# Patient Record
Sex: Female | Born: 1935 | Race: Black or African American | Hispanic: No | Marital: Single | Smoking: Never smoker
Health system: Southern US, Community
[De-identification: ages and names within clinical notes are randomized; demographics above are authoritative.]

## PROBLEM LIST (undated history)

## (undated) DIAGNOSIS — D649 Anemia, unspecified: Secondary | ICD-10-CM

## (undated) DIAGNOSIS — I639 Cerebral infarction, unspecified: Secondary | ICD-10-CM

## (undated) DIAGNOSIS — E119 Type 2 diabetes mellitus without complications: Secondary | ICD-10-CM

## (undated) DIAGNOSIS — F0390 Unspecified dementia without behavioral disturbance: Secondary | ICD-10-CM

## (undated) DIAGNOSIS — N390 Urinary tract infection, site not specified: Secondary | ICD-10-CM

## (undated) DIAGNOSIS — I10 Essential (primary) hypertension: Secondary | ICD-10-CM

## (undated) DIAGNOSIS — N3090 Cystitis, unspecified without hematuria: Secondary | ICD-10-CM

## (undated) HISTORY — PX: ABDOMINAL HYSTERECTOMY: SHX81

## (undated) HISTORY — PX: JOINT REPLACEMENT: SHX530

## (undated) HISTORY — PX: BLADDER SURGERY: SHX569

## (undated) HISTORY — PX: EYE SURGERY: SHX253

---

## 2000-04-24 ENCOUNTER — Encounter: Admission: RE | Admit: 2000-04-24 | Discharge: 2000-04-24 | Payer: Self-pay | Admitting: Orthopedic Surgery

## 2000-04-24 ENCOUNTER — Encounter: Payer: Self-pay | Admitting: Orthopedic Surgery

## 2000-04-25 ENCOUNTER — Ambulatory Visit (HOSPITAL_BASED_OUTPATIENT_CLINIC_OR_DEPARTMENT_OTHER): Admission: RE | Admit: 2000-04-25 | Discharge: 2000-04-25 | Payer: Self-pay | Admitting: Orthopedic Surgery

## 2009-01-12 ENCOUNTER — Encounter (INDEPENDENT_AMBULATORY_CARE_PROVIDER_SITE_OTHER): Payer: Self-pay | Admitting: Emergency Medicine

## 2009-01-12 ENCOUNTER — Emergency Department (HOSPITAL_BASED_OUTPATIENT_CLINIC_OR_DEPARTMENT_OTHER): Admission: EM | Admit: 2009-01-12 | Discharge: 2009-01-12 | Payer: Self-pay | Admitting: Emergency Medicine

## 2009-01-22 ENCOUNTER — Emergency Department (HOSPITAL_BASED_OUTPATIENT_CLINIC_OR_DEPARTMENT_OTHER): Admission: EM | Admit: 2009-01-22 | Discharge: 2009-01-22 | Payer: Self-pay | Admitting: Emergency Medicine

## 2011-01-28 NOTE — Op Note (Signed)
[place]  Patient:    Jamie Valentine, Jamie Valentine                          MRN: [id number] Proc. Date: 04/29/00 Attending:  Alinda Deem                           Operative Report  PREOPERATIVE DIAGNOSIS:  Right knee medial meniscal tear.  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  OPERATION PERFORMED:  Right knee partial arthroscopic medial and lateral meniscectomies and debridement of chondromalacia.  SURGEON:  Alinda Deem, M.D.  ASSISTANT:  None.  ANESTHESIA:  General endotracheal.  ESTIMATED BLOOD LOSS:  Minimal.  FLUID REPLACEMENT:  700 cc crystalloid.  DRAINS:  None.  TOURNIQUET TIME:  None.  INDICATIONS FOR PROCEDURE:  The patient is a 77 year old woman with radiographic mild to moderate arthritic changes of the right knee.  She has failed conservative treatment with anti-inflammatory medicines x 3, physical therapy, got temporary relief from a cortisone injection and after putting up with many months of pain and catching in her knee, now desires arthroscopic evaluation of her right knee.  Standing radiographs showed she had 3 or 4 mm of articular cartilage medially and 5 mm laterally and arthroscopy was thought to be appropriate by both the patient and her surgeon.  DESCRIPTION OF PROCEDURE:  The patient was identified by arm band and taken to the operating room at [facility] where the appropriate anesthetic monitors were attached and general endotracheal anesthesia induced with the patient in the supine position.  A lateral post was applied to the table and the right lower extremity prepped and draped in the usual sterile fashion from the ankle to midthigh.  Using a #11 blade, standard inferomedial and inferolateral peripatellar portals were then made  allowing introduction of the arthroscope through the inferolateral portal and the outflow through the inferomedial portal.  Small bits of articular cartilage were noted to come out the outflow with the initial placement.   We then performed arthroscopic evaluation of the knee and located grade 3 chondromalacia of the apex of the patella which was debrided back to a stable margin with a 3.5 mm gator sucker shaver.  The medial meniscus had a degenerative parrot beak tear of the posterior horn which was debrided with a basket forceps as well as a 3.5 gator sucker shaver and a 4.2 mm great white sucker shaver.  The articular cartilage and medial femoral condyle had grade 3 chondromalacia over a 1.2 cm strip and a focal area of grade 4 chondromalacia was located on the medial tibial condyle with peripheral flap tears which were also debrided.  The ACL and the PCL were intact.  The lateral meniscus had degenerative tears only and these were debrided back to stable margin.  The articular cartilage on the lateral side was in relatively good condition.  The knee was then washed out with normal saline solution.  The arthroscopic instruments were removed.  A dressing of Xeroform, 4 x 8 dressing sponges, Webril and an Ace wrap applied. The patient was awakened and taken to the recovery room without difficulty. D:  05/17/00 TD:  05/18/00 Job: [id number]

## 2015-06-01 ENCOUNTER — Emergency Department (HOSPITAL_BASED_OUTPATIENT_CLINIC_OR_DEPARTMENT_OTHER)
Admission: EM | Admit: 2015-06-01 | Discharge: 2015-06-02 | Disposition: A | Discharge status: Home / Self Care | Payer: Medicare Other | Attending: Emergency Medicine | Admitting: Emergency Medicine

## 2015-06-01 ENCOUNTER — Encounter (HOSPITAL_BASED_OUTPATIENT_CLINIC_OR_DEPARTMENT_OTHER): Payer: Self-pay | Admitting: Emergency Medicine

## 2015-06-01 DIAGNOSIS — F0390 Unspecified dementia without behavioral disturbance: Secondary | ICD-10-CM | POA: Insufficient documentation

## 2015-06-01 DIAGNOSIS — I10 Essential (primary) hypertension: Secondary | ICD-10-CM | POA: Diagnosis not present

## 2015-06-01 DIAGNOSIS — D649 Anemia, unspecified: Secondary | ICD-10-CM | POA: Insufficient documentation

## 2015-06-01 DIAGNOSIS — Z8673 Personal history of transient ischemic attack (TIA), and cerebral infarction without residual deficits: Secondary | ICD-10-CM | POA: Diagnosis not present

## 2015-06-01 DIAGNOSIS — T839XXA Unspecified complication of genitourinary prosthetic device, implant and graft, initial encounter: Secondary | ICD-10-CM

## 2015-06-01 DIAGNOSIS — Z7982 Long term (current) use of aspirin: Secondary | ICD-10-CM | POA: Insufficient documentation

## 2015-06-01 DIAGNOSIS — T83098A Other mechanical complication of other indwelling urethral catheter, initial encounter: Secondary | ICD-10-CM | POA: Insufficient documentation

## 2015-06-01 DIAGNOSIS — Z79899 Other long term (current) drug therapy: Secondary | ICD-10-CM | POA: Insufficient documentation

## 2015-06-01 DIAGNOSIS — N390 Urinary tract infection, site not specified: Secondary | ICD-10-CM | POA: Diagnosis not present

## 2015-06-01 DIAGNOSIS — E119 Type 2 diabetes mellitus without complications: Secondary | ICD-10-CM | POA: Insufficient documentation

## 2015-06-01 DIAGNOSIS — Y846 Urinary catheterization as the cause of abnormal reaction of the patient, or of later complication, without mention of misadventure at the time of the procedure: Secondary | ICD-10-CM | POA: Insufficient documentation

## 2015-06-01 HISTORY — DX: Type 2 diabetes mellitus without complications: E11.9

## 2015-06-01 HISTORY — DX: Cerebral infarction, unspecified: I63.9

## 2015-06-01 HISTORY — DX: Anemia, unspecified: D64.9

## 2015-06-01 HISTORY — DX: Cystitis, unspecified without hematuria: N30.90

## 2015-06-01 HISTORY — DX: Unspecified dementia, unspecified severity, without behavioral disturbance, psychotic disturbance, mood disturbance, and anxiety: F03.90

## 2015-06-01 HISTORY — DX: Essential (primary) hypertension: I10

## 2015-06-01 HISTORY — DX: Urinary tract infection, site not specified: N39.0

## 2015-06-01 MED ORDER — HYDROCODONE-ACETAMINOPHEN 5-325 MG PO TABS
1.0000 | ORAL_TABLET | Freq: Once | ORAL | Status: DC
  Filled 2015-06-01: qty 1

## 2015-06-01 MED ORDER — ONDANSETRON 4 MG PO TBDP
4.0000 mg | ORAL_TABLET | Freq: Once | ORAL | Status: DC
  Filled 2015-06-01: qty 1

## 2015-06-01 NOTE — ED Notes (Signed)
Per [facility] pt c/o chronic foley cath pain, no distress not injury, no urinary difficulty noted

## 2015-06-01 NOTE — ED Notes (Signed)
Per daughter patient has frequent pain with foley catheters and has to have them changed out.  Reports she was going to go to [facility] where her normal doctors are but states that she did not want to wait there.  Unable to access records from care everywhere.  History obtained from CNA that patient has at home.  Daughter at the bedside.  Daughter states "usually they change her catheter out and then give her pain medicine and send her on her way".

## 2015-06-01 NOTE — ED Provider Notes (Signed)
TIME SEEN: 11:27 PM  CHIEF COMPLAINT: Foley catheter pain   HPI:  HPI Comments: Jamie Valentine is a 81 y.o. female, with history of chronic indwelling Foley catheter, dementia, CVA, diabetes who presents to the Emergency Department complaining of pain to genital region where foley catheter is placed. She had the first catheter placed 4 months ago and this catheter was changed 1 month ago per patient's report. Per nursing home however they state patient had a catheter changed 5 days ago. The pt has pain at baseline with foley catheters and has them changed frequently per daughter. Daughter reported to nurse that the pt is normally seen at [facility] where they change the catheter and give her pain medication. Pt notes abdominal pain earlier today that has since resolved. Denies fevers or chills, nausea, vomiting or diarrhea, or hematuria.   ROS: See HPI Constitutional: no fever  Eyes: no drainage  ENT: no runny nose   Cardiovascular:  no chest pain  Resp: no SOB  GI: no vomiting GU: no dysuria Integumentary: no rash  Allergy: no hives  Musculoskeletal: no leg swelling  Neurological: no slurred speech ROS otherwise negative  PAST MEDICAL HISTORY/PAST SURGICAL HISTORY:  Past Medical History  Diagnosis Date  . Stroke   . Cystitis   . Diabetes mellitus without complication   . UTI (urinary tract infection)   . Dementia   . Anemia   . Hypertension     MEDICATIONS:  Prior to Admission medications   Medication Sig Start Date End Date Taking? Authorizing Provider  amLODipine (NORVASC) 5 MG tablet Take 5 mg by mouth daily.   Yes Historical Provider, MD  aspirin EC 81 MG tablet Take 81 mg by mouth daily.   Yes Historical Provider, MD  FERROUS SULFATE PO Take 65 mg by mouth.   Yes Historical Provider, MD  losartan (COZAAR) 25 MG tablet Take 25 mg by mouth daily.   Yes Historical Provider, MD  METFORMIN HCL ER PO Take 1,000 mg by mouth.   Yes Historical Provider, MD  mirtazapine  (REMERON) 30 MG tablet Take 30 mg by mouth at bedtime.   Yes Historical Provider, MD  pantoprazole (PROTONIX) 40 MG tablet Take 40 mg by mouth daily.   Yes Historical Provider, MD  pravastatin (PRAVACHOL) 40 MG tablet Take 40 mg by mouth daily.   Yes Historical Provider, MD  Vitamin D, Ergocalciferol, (DRISDOL) 50000 UNITS CAPS capsule Take 50,000 Units by mouth every 7 (seven) days.   Yes Historical Provider, MD    ALLERGIES:  Allergies  Allergen Reactions  . Ciprofloxacin   . Contrast Media [Iodinated Diagnostic Agents]   . Levaquin [Levofloxacin]   . Macrobid [Nitrofurantoin]   . Sulfa Antibiotics     SOCIAL HISTORY:  Social History  Substance Use Topics  . Smoking status: Never Smoker   . Smokeless tobacco: Not on file  . Alcohol Use: No    FAMILY HISTORY: History reviewed. No pertinent family history.  EXAM: BP 139/80 mmHg  Pulse 87  Temp(Src) 98 F (36.7 C) (Oral)  Resp 16  Wt 180 lb (81.647 kg)  SpO2 100% CONSTITUTIONAL: Alert and oriented and responds appropriately to questions. Well-appearing; well-nourished HEAD: Normocephalic EYES: Conjunctivae clear, PERRL ENT: normal nose; no rhinorrhea; moist mucous membranes; pharynx without lesions noted NECK: Supple, no meningismus, no LAD  CARD: RRR; S1 and S2 appreciated; no murmurs, no clicks, no rubs, no gallops RESP: Normal chest excursion without splinting or tachypnea; breath sounds clear and equal  bilaterally; no wheezes, no rhonchi, no rales,  ABD/GI: Normal bowel sounds; non-distended; soft, non-tender, no rebound, no guarding GU:  Normal external genitalia, indwelling Foley catheter in place in the urethra, small amount of yeast appearing vaginal discharge coming from the vagina, no vaginal bleeding BACK:  The back appears normal and is non-tender to palpation, there is no CVA tenderness EXT: Normal ROM in all joints; non-tender to palpation; no edema; normal capillary refill; no cyanosis    SKIN: Normal color  for age and race; warm NEURO: Moves all extremities equally PSYCH: The patient's mood and manner are appropriate. Grooming and personal hygiene are appropriate.  MEDICAL DECISION MAKING: Patient here with pain in her Foley catheter site. Abdominal exam benign. Afebrile and hemodynamically stable. Eating and drinking normally. No vomiting or diarrhea. Daughter reports patient for equally has this. Foley catheter has been replaced in the emergency department with some pain relief. She is draining urine normally. Urine shows large leukocytes and many bacteria. Likely colonized but given she is having pain will treat UTI. She is allergic to Macrobid, fluoroquinolones and sulfa. Will discharge on Keflex. We do not have any previous urine cultures here. We'll have her follow up with her primary care physician. Discussed plan with patient's daughter. She verbalized understanding and discomfort with this plan. I feel she is safe to be discharged back to her nursing home.   I personally performed the services described in this documentation, which was scribed in my presence. The recorded information has been reviewed and is accurate.       Layla Maw Ward, DO 06/02/15 [phone]

## 2015-06-02 DIAGNOSIS — T83098A Other mechanical complication of other indwelling urethral catheter, initial encounter: Secondary | ICD-10-CM | POA: Diagnosis not present

## 2015-06-02 LAB — URINALYSIS, ROUTINE W REFLEX MICROSCOPIC
Bilirubin Urine: NEGATIVE
GLUCOSE, UA: NEGATIVE mg/dL
HGB URINE DIPSTICK: NEGATIVE
Ketones, ur: NEGATIVE mg/dL
Nitrite: NEGATIVE
PROTEIN: NEGATIVE mg/dL
SPECIFIC GRAVITY, URINE: 1.006 (ref 1.005–1.030)
UROBILINOGEN UA: 0.2 mg/dL (ref 0.0–1.0)
pH: 7.5 (ref 5.0–8.0)

## 2015-06-02 LAB — URINE MICROSCOPIC-ADD ON

## 2015-06-02 MED ORDER — ACETAMINOPHEN 500 MG PO TABS
ORAL_TABLET | ORAL | Status: AC
  Administered 2015-06-02: 1000 mg
  Filled 2015-06-02: qty 2

## 2015-06-02 MED ORDER — CEPHALEXIN 500 MG PO CAPS: 500.0000 mg | ORAL_CAPSULE | Freq: Two times a day (BID) | ORAL | Status: DC

## 2015-06-02 MED ORDER — FLUCONAZOLE 50 MG PO TABS
150.0000 mg | ORAL_TABLET | Freq: Once | ORAL | Status: AC
  Administered 2015-06-02: 150 mg via ORAL
  Filled 2015-06-02 (×2): qty 1

## 2015-06-02 MED ORDER — TRAMADOL HCL 50 MG PO TABS: 50.0000 mg | ORAL_TABLET | Freq: Four times a day (QID) | ORAL | Status: AC | PRN

## 2015-06-02 MED ORDER — CEPHALEXIN 250 MG PO CAPS
500.0000 mg | ORAL_CAPSULE | Freq: Once | ORAL | Status: AC
  Administered 2015-06-02: 500 mg via ORAL
  Filled 2015-06-02: qty 2

## 2015-06-02 NOTE — ED Notes (Signed)
Awaiting [facility] arrival to transport patient home.

## 2015-06-02 NOTE — ED Notes (Addendum)
Pt arrived to [facility] ED with a Foley Catheter in place, for urinary retention, per daughter. This catheter was removed - catheter tube intact, 10ml drained from balloon and replaced per ED order with Diane at bedside to confirm sterile technique. Perineal care performed. 91F foley inserted via sterile technique without difficulty, leg securement device place. Pt tolerated well and reports her pelvic pain has improved.

## 2015-06-02 NOTE — Discharge Instructions (Signed)
Urinary Tract Infection °Urinary tract infections (UTIs) can develop anywhere along your urinary tract. Your urinary tract is your body's drainage system for removing wastes and extra water. Your urinary tract includes two kidneys, two ureters, a bladder, and a urethra. Your kidneys are a pair of bean-shaped organs. Each kidney is about the size of your fist. They are located below your ribs, one on each side of your spine. °CAUSES °Infections are caused by microbes, which are microscopic organisms, including fungi, viruses, and bacteria. These organisms are so small that they can only be seen through a microscope. Bacteria are the microbes that most commonly cause UTIs. °SYMPTOMS  °Symptoms of UTIs may vary by age and gender of the patient and by the location of the infection. Symptoms in young women typically include a frequent and intense urge to urinate and a painful, burning feeling in the bladder or urethra during urination. Older women and men are more likely to be tired, shaky, and weak and have muscle aches and abdominal pain. A fever may mean the infection is in your kidneys. Other symptoms of a kidney infection include pain in your back or sides below the ribs, nausea, and vomiting. °DIAGNOSIS °To diagnose a UTI, your caregiver will ask you about your symptoms. Your caregiver also will ask to provide a urine sample. The urine sample will be tested for bacteria and white blood cells. White blood cells are made by your body to help fight infection. °TREATMENT  °Typically, UTIs can be treated with medication. Because most UTIs are caused by a bacterial infection, they usually can be treated with the use of antibiotics. The choice of antibiotic and length of treatment depend on your symptoms and the type of bacteria causing your infection. °HOME CARE INSTRUCTIONS °· If you were prescribed antibiotics, take them exactly as your caregiver instructs you. Finish the medication even if you feel better after you  have only taken some of the medication. °· Drink enough water and fluids to keep your urine clear or pale yellow. °· Avoid caffeine, tea, and carbonated beverages. They tend to irritate your bladder. °· Empty your bladder often. Avoid holding urine for long periods of time. °· Empty your bladder before and after sexual intercourse. °· After a bowel movement, women should cleanse from front to back. Use each tissue only once. °SEEK MEDICAL CARE IF:  °1. You have back pain. °2. You develop a fever. °3. Your symptoms do not begin to resolve within 3 days. °SEEK IMMEDIATE MEDICAL CARE IF:  °1. You have severe back pain or lower abdominal pain. °2. You develop chills. °3. You have nausea or vomiting. °4. You have continued burning or discomfort with urination. °MAKE SURE YOU:  °1. Understand these instructions. °2. Will watch your condition. °3. Will get help right away if you are not doing well or get worse. °Document Released: 06/08/2005 Document Revised: 02/28/2012 Document Reviewed: 10/07/2011 °ExitCare® Patient Information ©2015 [employer]. This information is not intended to replace advice given to you by your health care provider. Make sure you discuss any questions you have with your health care provider. ° °Foley Catheter Care °A Foley catheter is a soft, flexible tube that is placed into the bladder to drain urine. A Foley catheter may be inserted if: °· You leak urine or are not able to control when you urinate (urinary incontinence). °· You are not able to urinate when you need to (urinary retention). °· You had prostate surgery or surgery on the genitals. °·   You have certain medical conditions, such as multiple sclerosis, dementia, or a spinal cord injury. °If you are going home with a Foley catheter in place, follow the instructions below. °TAKING CARE OF THE CATHETER °4. Wash your hands with soap and water. °5. Using mild soap and warm water on a clean washcloth: °· Clean the area on your body closest  to the catheter insertion site using a circular motion, moving away from the catheter. Never wipe toward the catheter because this could sweep bacteria up into the urethra and cause infection. °· Remove all traces of soap. Pat the area dry with a clean towel. For males, reposition the foreskin. °6. Attach the catheter to your leg so there is no tension on the catheter. Use adhesive tape or a leg strap. If you are using adhesive tape, remove any sticky residue left behind by the previous tape you used. °7. Keep the drainage bag below the level of the bladder, but keep it off the floor. °8. Check throughout the day to be sure the catheter is working and urine is draining freely. Make sure the tubing does not become kinked. °9. Do not pull on the catheter or try to remove it. Pulling could damage internal tissues. °TAKING CARE OF THE DRAINAGE BAGS °You will be given two drainage bags to take home. One is a large overnight drainage bag, and the other is a smaller leg bag that fits underneath clothing. You may wear the overnight bag at any time, but you should never wear the smaller leg bag at night. Follow the instructions below for how to empty, change, and clean your drainage bags. °Emptying the Drainage Bag °You must empty your drainage bag when it is  -½ full or at least 2-3 times a day. °5. Wash your hands with soap and water. °6. Keep the drainage bag below your hips, below the level of your bladder. This stops urine from going back into the tubing and into your bladder. °7. Hold the dirty bag over the toilet or a clean container. °8. Open the pour spout at the bottom of the bag and empty the urine into the toilet or container. Do not let the pour spout touch the toilet, container, or any other surface. Doing so can place bacteria on the bag, which can cause an infection. °9. Clean the pour spout with a gauze pad or cotton ball that has rubbing alcohol on it. °10. Close the pour spout. °11. Attach the bag to your  leg with adhesive tape or a leg strap. °12. Wash your hands well. °Changing the Drainage Bag °Change your drainage bag once a month or sooner if it starts to smell bad or look dirty. Below are steps to follow when changing the drainage bag. °4. Wash your hands with soap and water. °5. Pinch off the rubber catheter so that urine does not spill out. °6. Disconnect the catheter tube from the drainage tube at the connection valve. Do not let the tubes touch any surface. °7. Clean the end of the catheter tube with an alcohol wipe. Use a different alcohol wipe to clean the end of the drainage tube. °8. Connect the catheter tube to the drainage tube of the clean drainage bag. °9. Attach the new bag to the leg with adhesive tape or a leg strap. Avoid attaching the new bag too tightly. °10. Wash your hands well. °Cleaning the Drainage Bag °1. Wash your hands with soap and water. °2. Wash the bag in warm, soapy   water. °3. Rinse the bag thoroughly with warm water. °4. Fill the bag with a solution of white vinegar and water (1 cup vinegar to 1 qt warm water [.2 L vinegar to 1 L warm water]). Close the bag and soak it for 30 minutes in the solution. °5. Rinse the bag with warm water. °6. Hang the bag to dry with the pour spout open and hanging downward. °7. Store the clean bag (once it is dry) in a clean plastic bag. °8. Wash your hands well. °PREVENTING INFECTION °· Wash your hands before and after handling your catheter. °· Take showers daily and wash the area where the catheter enters your body. Do not take baths. Replace wet leg straps with dry ones, if this applies. °· Do not use powders, sprays, or lotions on the genital area. Only use creams, lotions, or ointments as directed by your caregiver. °· For females, wipe from front to back after each bowel movement. °· Drink enough fluids to keep your urine clear or pale yellow unless you have a fluid restriction. °· Do not let the drainage bag or tubing touch or lie on the  floor. °· Wear cotton underwear to absorb moisture and to keep your skin drier. °SEEK MEDICAL CARE IF:  °· Your urine is cloudy or smells unusually bad. °· Your catheter becomes clogged. °· You are not draining urine into the bag or your bladder feels full. °· Your catheter starts to leak. °SEEK IMMEDIATE MEDICAL CARE IF:  °· You have pain, swelling, redness, or pus where the catheter enters the body. °· You have pain in the abdomen, legs, lower back, or bladder. °· You have a fever. °· You see blood fill the catheter, or your urine is pink or red. °· You have nausea, vomiting, or chills. °· Your catheter gets pulled out. °MAKE SURE YOU:  °· Understand these instructions. °· Will watch your condition. °· Will get help right away if you are not doing well or get worse. °Document Released: 08/29/2005 Document Revised: 01/13/2014 Document Reviewed: 08/20/2012 °ExitCare® Patient Information ©2015 [employer]. This information is not intended to replace advice given to you by your health care provider. Make sure you discuss any questions you have with your health care provider. ° °

## 2015-06-02 NOTE — ED Notes (Signed)
[facility] notified for transport 

## 2015-06-02 NOTE — ED Notes (Signed)
Assumed care of patient from [place], [place]. Pt awaiting [facility]P disposition.

## 2015-06-04 LAB — URINE CULTURE: Culture: >=100000

## 2015-06-05 ENCOUNTER — Telehealth (HOSPITAL_BASED_OUTPATIENT_CLINIC_OR_DEPARTMENT_OTHER): Payer: Self-pay | Admitting: Emergency Medicine

## 2015-06-05 NOTE — Progress Notes (Signed)
ED Antimicrobial Stewardship Positive Culture Follow Up   Jamie Valentine is an 81 y.o. female who presented to [facility] on 06/01/2015 with a chief complaint of  Chief Complaint  Patient presents with  . foley cath pain    Recent Results (from the past 720 hour(s))  Urine culture     Status: None   Collection Time: 06/02/15 12:55 AM  Result Value Ref Range Status   Specimen Description URINE, CLEAN CATCH  Final   Special Requests NONE  Final   Culture   Final    >=100,000 COLONIES/mL ESCHERICHIA COLI Confirmed Extended Spectrum Beta-Lactamase Producer (ESBL) Performed at [facility]    Report Status 06/04/2015 FINAL  Final   Organism ID, Bacteria ESCHERICHIA COLI  Final      Susceptibility   Escherichia coli - MIC*    AMPICILLIN >=32 RESISTANT Resistant     CEFAZOLIN >=64 RESISTANT Resistant     CEFTRIAXONE 32 RESISTANT Resistant     CIPROFLOXACIN >=4 RESISTANT Resistant     GENTAMICIN <=1 SENSITIVE Sensitive     IMIPENEM <=0.25 SENSITIVE Sensitive     NITROFURANTOIN <=16 SENSITIVE Sensitive     TRIMETH/SULFA >=320 RESISTANT Resistant     AMPICILLIN/SULBACTAM 8 SENSITIVE Sensitive     PIP/TAZO <=4 SENSITIVE Sensitive     * >=100,000 COLONIES/mL ESCHERICHIA COLI     Treated with cephalexin, organism resistant to prescribed antimicrobial No new antibiotic prescription  ED Provider: Ivar Drape, PA-C   Greggory Stallion, PharmD Clinical Pharmacy Resident Pager # [phone] 06/05/2015 9:45 AM   Infectious Diseases Pharmacist Phone# [phone]

## 2015-06-05 NOTE — Telephone Encounter (Signed)
Attempting to call pt and EC. Urine culture shows ESBL, recommendation per Ivar Drape PA stop cephalexin, no symptoms while in ED, bacteria resistant to Cephalexin.

## 2015-06-10 ENCOUNTER — Telehealth: Payer: Self-pay | Admitting: *Deleted

## 2015-06-10 NOTE — ED Notes (Signed)
Spoke with daughter who is responding to letter sent to patient regarding antibiotic therapy.  Per Ivar Drape, PA advise patient to stop Cephalexin.  Per daughter,  Patient is currently under the care of her PCP and is not on any antibiotic therapy due to reactions to various antibiotic regimens.  Advised to continue with advisement of PCP.

## 2015-06-28 ENCOUNTER — Emergency Department (HOSPITAL_BASED_OUTPATIENT_CLINIC_OR_DEPARTMENT_OTHER)
Admission: EM | Admit: 2015-06-28 | Discharge: 2015-06-28 | Disposition: A | Discharge status: Other Acute Inpatient Hospital | Payer: Medicare Other | Attending: Emergency Medicine | Admitting: Emergency Medicine

## 2015-06-28 ENCOUNTER — Emergency Department (HOSPITAL_BASED_OUTPATIENT_CLINIC_OR_DEPARTMENT_OTHER): Payer: Medicare Other

## 2015-06-28 ENCOUNTER — Encounter (HOSPITAL_BASED_OUTPATIENT_CLINIC_OR_DEPARTMENT_OTHER): Payer: Self-pay | Admitting: Emergency Medicine

## 2015-06-28 DIAGNOSIS — Z8673 Personal history of transient ischemic attack (TIA), and cerebral infarction without residual deficits: Secondary | ICD-10-CM | POA: Insufficient documentation

## 2015-06-28 DIAGNOSIS — A419 Sepsis, unspecified organism: Secondary | ICD-10-CM | POA: Diagnosis not present

## 2015-06-28 DIAGNOSIS — B0230 Zoster ocular disease, unspecified: Secondary | ICD-10-CM | POA: Insufficient documentation

## 2015-06-28 DIAGNOSIS — N12 Tubulo-interstitial nephritis, not specified as acute or chronic: Secondary | ICD-10-CM | POA: Insufficient documentation

## 2015-06-28 DIAGNOSIS — Z87440 Personal history of urinary (tract) infections: Secondary | ICD-10-CM | POA: Insufficient documentation

## 2015-06-28 DIAGNOSIS — Z7982 Long term (current) use of aspirin: Secondary | ICD-10-CM | POA: Insufficient documentation

## 2015-06-28 DIAGNOSIS — Z79899 Other long term (current) drug therapy: Secondary | ICD-10-CM | POA: Diagnosis not present

## 2015-06-28 DIAGNOSIS — D649 Anemia, unspecified: Secondary | ICD-10-CM | POA: Diagnosis not present

## 2015-06-28 DIAGNOSIS — I10 Essential (primary) hypertension: Secondary | ICD-10-CM | POA: Insufficient documentation

## 2015-06-28 DIAGNOSIS — Z792 Long term (current) use of antibiotics: Secondary | ICD-10-CM | POA: Diagnosis not present

## 2015-06-28 DIAGNOSIS — R21 Rash and other nonspecific skin eruption: Secondary | ICD-10-CM | POA: Diagnosis present

## 2015-06-28 DIAGNOSIS — F0390 Unspecified dementia without behavioral disturbance: Secondary | ICD-10-CM | POA: Diagnosis not present

## 2015-06-28 DIAGNOSIS — R109 Unspecified abdominal pain: Secondary | ICD-10-CM

## 2015-06-28 DIAGNOSIS — E119 Type 2 diabetes mellitus without complications: Secondary | ICD-10-CM | POA: Diagnosis not present

## 2015-06-28 LAB — CBC WITH DIFFERENTIAL/PLATELET
BASOS ABS: 0 10*3/uL (ref 0.0–0.1)
Basophils Relative: 0 %
EOS ABS: 0.1 10*3/uL (ref 0.0–0.7)
EOS PCT: 1 %
HCT: 32.7 % — ABNORMAL LOW (ref 36.0–46.0)
Hemoglobin: 10.5 g/dL — ABNORMAL LOW (ref 12.0–15.0)
Lymphocytes Relative: 26 %
Lymphs Abs: 1.5 10*3/uL (ref 0.7–4.0)
MCH: 26.3 pg (ref 26.0–34.0)
MCHC: 32.1 g/dL (ref 30.0–36.0)
MCV: 82 fL (ref 78.0–100.0)
MONO ABS: 0.4 10*3/uL (ref 0.1–1.0)
Monocytes Relative: 7 %
Neutro Abs: 3.7 10*3/uL (ref 1.7–7.7)
Neutrophils Relative %: 66 %
PLATELETS: 357 10*3/uL (ref 150–400)
RBC: 3.99 MIL/uL (ref 3.87–5.11)
RDW: 17 % — AB (ref 11.5–15.5)
WBC: 5.6 10*3/uL (ref 4.0–10.5)

## 2015-06-28 LAB — COMPREHENSIVE METABOLIC PANEL
ALT: 19 U/L (ref 14–54)
ANION GAP: 7 (ref 5–15)
AST: 25 U/L (ref 15–41)
Albumin: 3.3 g/dL — ABNORMAL LOW (ref 3.5–5.0)
Alkaline Phosphatase: 74 U/L (ref 38–126)
BUN: 14 mg/dL (ref 6–20)
CHLORIDE: 105 mmol/L (ref 101–111)
CO2: 25 mmol/L (ref 22–32)
CREATININE: 0.62 mg/dL (ref 0.44–1.00)
Calcium: 9.6 mg/dL (ref 8.9–10.3)
GFR calc non Af Amer: >60 mL/min (ref >60)
GLUCOSE: 153 mg/dL — AB (ref 65–99)
Potassium: 4 mmol/L (ref 3.5–5.1)
SODIUM: 137 mmol/L (ref 135–145)
Total Bilirubin: 0.2 mg/dL — ABNORMAL LOW (ref 0.3–1.2)
Total Protein: 7.2 g/dL (ref 6.5–8.1)

## 2015-06-28 LAB — URINALYSIS, ROUTINE W REFLEX MICROSCOPIC
Bilirubin Urine: NEGATIVE
Glucose, UA: NEGATIVE mg/dL
Hgb urine dipstick: NEGATIVE
Ketones, ur: NEGATIVE mg/dL
NITRITE: POSITIVE — AB
Protein, ur: NEGATIVE mg/dL
SPECIFIC GRAVITY, URINE: 1.011 (ref 1.005–1.030)
UROBILINOGEN UA: 0.2 mg/dL (ref 0.0–1.0)
pH: 7 (ref 5.0–8.0)

## 2015-06-28 LAB — URINE MICROSCOPIC-ADD ON

## 2015-06-28 LAB — I-STAT CG4 LACTIC ACID, ED
Lactic Acid, Venous: 2.69 mmol/L (ref 0.5–2.0)
Lactic Acid, Venous: 1.96 mmol/L (ref 0.5–2.0)

## 2015-06-28 MED ORDER — FLUORESCEIN SODIUM 1 MG OP STRP
1.0000 | ORAL_STRIP | Freq: Once | OPHTHALMIC | Status: AC
  Administered 2015-06-28: 1 via OPHTHALMIC
  Filled 2015-06-28: qty 1

## 2015-06-28 MED ORDER — ERYTHROMYCIN 5 MG/GM OP OINT
TOPICAL_OINTMENT | Freq: Once | OPHTHALMIC | Status: AC
  Administered 2015-06-28: 17:00:00 via OPHTHALMIC
  Filled 2015-06-28: qty 3.5

## 2015-06-28 MED ORDER — SODIUM CHLORIDE 0.9 % IV BOLUS (SEPSIS)
1000.0000 mL | Freq: Once | INTRAVENOUS | Status: AC
Start: 2015-06-28 — End: 2015-06-28
  Administered 2015-06-28: 1000 mL via INTRAVENOUS

## 2015-06-28 MED ORDER — VALACYCLOVIR HCL 500 MG PO TABS
1000.0000 mg | ORAL_TABLET | Freq: Once | ORAL | Status: AC
  Administered 2015-06-28: 1000 mg via ORAL
  Filled 2015-06-28: qty 2

## 2015-06-28 MED ORDER — SODIUM CHLORIDE 0.9 % IV SOLN: Freq: Once | INTRAVENOUS | Status: DC

## 2015-06-28 MED ORDER — PIPERACILLIN-TAZOBACTAM 3.375 G IVPB
3.3750 g | Freq: Once | INTRAVENOUS | Status: AC
  Administered 2015-06-28: 3.375 g via INTRAVENOUS
  Filled 2015-06-28: qty 50

## 2015-06-28 MED ORDER — TETRACAINE HCL 0.5 % OP SOLN
2.0000 [drp] | Freq: Once | OPHTHALMIC | Status: AC
  Administered 2015-06-28: 2 [drp] via OPHTHALMIC
  Filled 2015-06-28: qty 2

## 2015-06-28 NOTE — ED Provider Notes (Signed)
CSN: [id number]     Arrival date & time 06/28/15  1438 History  By signing my name below, I, Budd Palmer, attest that this documentation has been prepared under the direction and in the presence of Glynn Octave, MD. Electronically Signed: Budd Palmer, ED Scribe. 06/28/2015. 5:06 PM.    Chief Complaint  Patient presents with  . Rash   The history is provided by the patient and a relative. No language interpreter was used.   HPI Comments: Jamie Valentine is a 81 y.o. female with a PMHx of stroke, DM, HTN, and dementia brought in by ambulance, who presents to the Emergency Department complaining of an itchy, painful, worsening rash to the forehead and lower back onset 1 day ago. She states she knows where she is, but not what day it is. She reports associated HA, fever (Tmax 100.1), lower back pain, right knee pain, and right eye pain with blurry vision. Pt states her knee pain began after she had a stroke, and daughter notes that pt has also been weaker on the right side since then. She states pt has been drinking fluids, but has had some loss of appetite and has not been eating well. Per daughter pt was seen last week at [facility] for a HA, where she was diagnosed with a UTI and prescribed antibiotics. Daughter notes that pt is allergic to antibiotics and was thus not given any. She states that the pt has a foley catheter because she has difficulty urinating, and notes that it was last changed 3 weeks ago. Pt reports that she lives with her daughter and is not ambulatory. She states she spends all day in bed. Pt denies CP, vomiting, abdominal pain, visual disturbances in the left eye, dizziness, and lightheadedness.   Past Medical History  Diagnosis Date  . Stroke (HCC)   . Cystitis   . Diabetes mellitus without complication (HCC)   . UTI (urinary tract infection)   . Dementia   . Anemia   . Hypertension    History reviewed. No pertinent past surgical history. History  reviewed. No pertinent family history. Social History  Substance Use Topics  . Smoking status: Never Smoker   . Smokeless tobacco: None  . Alcohol Use: No   OB History    No data available     Review of Systems A complete 10 system review of systems was obtained and all systems are negative except as noted in the HPI and PMH.   Allergies  Ciprofloxacin; Contrast media; Levaquin; Macrobid; and Sulfa antibiotics  Home Medications   Prior to Admission medications   Medication Sig Start Date End Date Taking? Authorizing Provider  amLODipine (NORVASC) 5 MG tablet Take 5 mg by mouth daily.    Historical Provider, MD  aspirin EC 81 MG tablet Take 81 mg by mouth daily.    Historical Provider, MD  cephALEXin (KEFLEX) 500 MG capsule Take 1 capsule (500 mg total) by mouth 2 (two) times daily. 06/02/15   Kristen N Ward, DO  FERROUS SULFATE PO Take 65 mg by mouth.    Historical Provider, MD  losartan (COZAAR) 25 MG tablet Take 25 mg by mouth daily.    Historical Provider, MD  METFORMIN HCL ER PO Take 1,000 mg by mouth.    Historical Provider, MD  mirtazapine (REMERON) 30 MG tablet Take 30 mg by mouth at bedtime.    Historical Provider, MD  pantoprazole (PROTONIX) 40 MG tablet Take 40 mg by mouth daily.  Historical Provider, MD  pravastatin (PRAVACHOL) 40 MG tablet Take 40 mg by mouth daily.    Historical Provider, MD  traMADol (ULTRAM) 50 MG tablet Take 1 tablet (50 mg total) by mouth every 6 (six) hours as needed. 06/02/15   Kristen N Ward, DO  Vitamin D, Ergocalciferol, (DRISDOL) 50000 UNITS CAPS capsule Take 50,000 Units by mouth every 7 (seven) days.    Historical Provider, MD   BP 125/76 mmHg  Pulse 85  Temp(Src) 100 F (37.8 C) (Rectal)  Resp 19  Wt 175 lb (79.379 kg)  SpO2 100% Physical Exam  Constitutional: She is oriented to person, place, and time. She appears well-developed and well-nourished. No distress.  HENT:  Head: Normocephalic and atraumatic.  Mouth/Throat: Oropharynx  is clear and moist. No oropharyngeal exudate.  Eyes: Conjunctivae and EOM are normal. Pupils are equal, round, and reactive to light.  Dendritic appearing fluorescein uptake at the 7 o'clock position of the right cornea; R intraocular pressure is 18; no pain with EOM  Neck: Normal range of motion. Neck supple.  No meningismus.  Cardiovascular: Normal rate, regular rhythm, normal heart sounds and intact distal pulses.   No murmur heard. Pulmonary/Chest: Effort normal and breath sounds normal. No respiratory distress.  Abdominal: Soft. There is no tenderness. There is no rebound and no guarding.  Musculoskeletal: Normal range of motion. She exhibits tenderness. She exhibits no edema.  Paraspinal lumbar TTP bilat with areas of hyperpigmentation and excoriation TTP R knee with reduced ROM.  Neurological: She is alert and oriented to person, place, and time. No cranial nerve deficit. She exhibits normal muscle tone. Coordination normal.   Bed bound, moves all extremities equally  Skin: Skin is warm.  Vesicular appearing rash to R forehead and R upper lid  Psychiatric: She has a normal mood and affect. Her behavior is normal.  Nursing note and vitals reviewed.   ED Course  Procedures  DIAGNOSTIC STUDIES: Oxygen Saturation is 98% on RA, normal by my interpretation.    COORDINATION OF CARE: 3:16 PM - Discussed probable scabies and performed an eye exam. Discussed plans to order diagnostic studies and imaging. Pt advised of plan for treatment and pt agrees.  5:04 PM - Discussed lab results of UTI. Discussed plans to admit. Pt advised of plan for treatment and pt agrees.  Labs Review Labs Reviewed  CBC WITH DIFFERENTIAL/PLATELET - Abnormal; Notable for the following:    Hemoglobin 10.5 (*)    HCT 32.7 (*)    RDW 17.0 (*)    All other components within normal limits  COMPREHENSIVE METABOLIC PANEL - Abnormal; Notable for the following:    Glucose, Bld 153 (*)    Albumin 3.3 (*)    Total  Bilirubin 0.2 (*)    All other components within normal limits  URINALYSIS, ROUTINE W REFLEX MICROSCOPIC (NOT AT [facility]) - Abnormal; Notable for the following:    APPearance CLOUDY (*)    Nitrite POSITIVE (*)    Leukocytes, UA LARGE (*)    All other components within normal limits  URINE MICROSCOPIC-ADD ON - Abnormal; Notable for the following:    Bacteria, UA MANY (*)    Casts GRANULAR CAST (*)    All other components within normal limits  I-STAT CG4 LACTIC ACID, ED - Abnormal; Notable for the following:    Lactic Acid, Venous 2.69 (*)    All other components within normal limits  URINE CULTURE  CULTURE, BLOOD (ROUTINE X 2)  CULTURE, BLOOD (ROUTINE X 2)  I-STAT CG4 LACTIC ACID, ED    Imaging Review Dg Chest 2 View  06/28/2015  CLINICAL DATA:  Mild fever today 100.1 current history of urinary tract infection EXAM: CHEST  2 VIEW COMPARISON:  04/08/2015 FINDINGS: Heart size is normal. Lungs are clear except for discoid atelectasis left lower lobe. IMPRESSION: No active cardiopulmonary disease. Electronically Signed   By: Esperanza Heiraymond  Rubner M.D.   On: 06/28/2015 16:43   Dg Knee Complete 4 Views Right  06/28/2015  CLINICAL DATA:  Right knee pain starting 1 month ago EXAM: RIGHT KNEE - COMPLETE 4+ VIEW COMPARISON:  04/08/2015 FINDINGS: Four views of the right knee submitted. Significant narrowing of medial joint compartment again noted. There is spurring of medial femoral condyle and medial tibial plateau. Narrowing of patellofemoral joint space. No joint effusion. No acute fracture or subluxation. IMPRESSION: No acute fracture or subluxation. Significant osteoarthritic changes as described above. Electronically Signed   By: Natasha MeadLiviu  Pop M.D.   On: 06/28/2015 16:41   Ct Renal Stone Study  06/28/2015  CLINICAL DATA:  Pt in from home via EMS c/o painful rash to forehead, noted to appear herpes-like in nature. EMS noted mild fever of 100.1 and administered 1 g Tylenol en route, temp now 98. Pt was dx  with UTI yesterday but started on no abx, reason unknown. Pt endorses R flank pain EXAM: CT ABDOMEN AND PELVIS WITHOUT CONTRAST TECHNIQUE: Multidetector CT imaging of the abdomen and pelvis was performed following the standard protocol without IV contrast. COMPARISON:  02/24/2015 FINDINGS: Lung bases: Mild bronchiectasis in the posterior medial left lower lobe. Minor reticular opacity dependently likely subsegmental atelectasis. Lung bases otherwise clear. Heart normal in size. Liver, spleen, pancreas, adrenal glands:  Unremarkable. Gallbladder surgically absent.  No bile duct dilation. Kidneys, ureters, bladder: Right kidney demonstrates some indistinctness of its cortical margin. There is a low-density lesion in the anterior midpole the right kidney and another in the anterolateral aspect of the lower pole. These were present previously and are likely cysts. The indistinctness of the cortical margin of the right kidney was present previously as well. No renal stones. No hydronephrosis. Ureters normal course and in caliber. Bladder is mostly decompressed by Foley catheter and not well evaluated. Uterus and adnexa: Uterus surgically absent. No pelvic/ adnexal masses. Lymph nodes:  No adenopathy. Ascites:  None. Gastrointestinal: Stomach, small bowel and colon are unremarkable. Normal appendix visualized. Musculoskeletal: Degenerative changes are mostly lower lumbar spine facet degenerative change. No osteoblastic or osteolytic lesions. IMPRESSION: 1. Right kidney shows some relative indistinctness of its cortical margin. This could potentially reflect pyelonephritis, but is more likely chronic since it was present previously. 2. Probable right renal cysts. 3. No hydronephrosis. 4. Bladder not well evaluated, being decompressed by Foley catheter. 5. Other chronic findings include changes from a cholecystectomy and hysterectomy as well as degenerative changes of the visualized spine. Electronically Signed   By: Amie Portlandavid   Ormond M.D.   On: 06/28/2015 16:41   I have personally reviewed and evaluated these images and lab results as part of my medical decision-making.   EKG Interpretation None      MDM   Final diagnoses:  Flank pain  Sepsis, due to unspecified organism [facility](HCC)  Pyelonephritis  Zoster ophthalmicus   patient from home with 2 day history of headache and rash. Seen at outside ED 4 days ago for headache with negative CT. Today has apparent zoster rash to right forehead and upper eyelid. Denies any vision change but does complain of  eye pain. Also complains of fever to 100.1 as well as paraspinal low back pain. Has indwelling Foley last changed one week ago. She was prescribed Macrobid at ED visit 4 days ago but not did not fill it due to allergy.  Concern for herpes zoster ophthalmicus. Discussed with ophthalmology Dr. Charlotte Sanes she recommends Valtrex, 1 g every 8 hours, erythromycin ointment 4 times a day. Does not recommend steroids. Needs ophthalmology follow-up within 2 days.  UA positive for infection.  No obstruction on CT.   WBC normal.  Lactate 2.7 with Fever 100.  Treat for sepsis.  IVF, cultures, Zosyn given allergies and urine culture senstivities previously.  Admission discussed with [place] [facility] hospitalist Dr. Renee Rival.   I personally performed the services described in this documentation, which was scribed in my presence. The recorded information has been reviewed and is accurate.   Glynn Octave, MD 06/28/15 [phone]

## 2015-06-28 NOTE — ED Notes (Signed)
Pt in from home via EMS c/o painful rash to forehead, noted to appear herpes-like in nature. EMS noted mild fever of 100.1 and administered 1 g Tylenol en route, temp now 98. Pt was dx with UTI yesterday but started on no abx, reason unknown. Pt endorses R flank pain.

## 2015-06-28 NOTE — ED Notes (Signed)
Foley catheter leaking small amount of urine [facility] Offered to change prior to transfer, [facility] had already loaded patient on stretcher and was ready for transport

## 2015-06-28 NOTE — ED Notes (Signed)
[facility]

## 2015-07-02 LAB — URINE CULTURE

## 2015-07-03 NOTE — Progress Notes (Signed)
ED Antimicrobial Stewardship Positive Culture Follow Up   Jamie BaconHelen L Dyke is an 81 y.o. female who [facility] with a chief complaint of  Chief Complaint  Patient presents with  . Rash    Recent Results (from the past 720 hour(s))  Urine culture     Status: None   Collection Time: 06/28/15  3:46 PM  Result Value Ref Range Status   Specimen Description URINE, CATHETERIZED  Final   Special Requests NONE  Final   Culture   Final    >=100,000 COLONIES/mL PSEUDOMONAS AERUGINOSA >=100,000 COLONIES/mL ESCHERICHIA COLI Confirmed Extended Spectrum Beta-Lactamase Producer (ESBL) Performed at [facility]    Report Status 07/02/2015 FINAL  Final   Organism ID, Bacteria PSEUDOMONAS AERUGINOSA  Final   Organism ID, Bacteria ESCHERICHIA COLI  Final      Susceptibility   Escherichia coli - MIC*    AMPICILLIN >=32 RESISTANT Resistant     CEFAZOLIN >=64 RESISTANT Resistant     CEFTRIAXONE >=64 RESISTANT Resistant     CIPROFLOXACIN >=4 RESISTANT Resistant     GENTAMICIN <=1 SENSITIVE Sensitive     IMIPENEM <=0.25 SENSITIVE Sensitive     NITROFURANTOIN <=16 SENSITIVE Sensitive     TRIMETH/SULFA >=320 RESISTANT Resistant     AMPICILLIN/SULBACTAM 4 SENSITIVE Sensitive     PIP/TAZO <=4 SENSITIVE Sensitive     * >=100,000 COLONIES/mL ESCHERICHIA COLI   Pseudomonas aeruginosa - MIC*    CEFTAZIDIME 4 SENSITIVE Sensitive     CIPROFLOXACIN >=4 RESISTANT Resistant     GENTAMICIN <=1 SENSITIVE Sensitive     IMIPENEM 2 SENSITIVE Sensitive     PIP/TAZO 8 SENSITIVE Sensitive     CEFEPIME 2 SENSITIVE Sensitive     * >=100,000 COLONIES/mL PSEUDOMONAS AERUGINOSA  Culture, blood (routine x 2)     Status: None (Preliminary result)   Collection Time: 06/28/15  4:30 PM  Result Value Ref Range Status   Specimen Description BLOOD RIGHT ARM  Final   Special Requests BOTTLES DRAWN AEROBIC AND ANAEROBIC 5CC EACH  Final   Culture   Final    NO GROWTH 3 DAYS Performed at [facility]  [place]    Report Status PENDING  Incomplete  Culture, blood (routine x 2)     Status: None (Preliminary result)   Collection Time: 06/28/15  4:40 PM  Result Value Ref Range Status   Specimen Description BLOOD LEFT HAND  Final   Special Requests BOTTLES DRAWN AEROBIC AND ANAEROBIC 5CC EACH  Final   Culture   Final    NO GROWTH 3 DAYS Performed at [facility]     Report Status PENDING  Incomplete    ED Provider: Atha StarksHannah C Muthersbaugh PA-C  [facility]. [facility]   Sandi CarneNick Shekita Boyden, PharmD Pharmacy Resident Pager: [phone] 07/03/2015, 9:13 AM Infectious Diseases Pharmacist Phone# [phone]

## 2015-07-04 LAB — CULTURE, BLOOD (ROUTINE X 2)
CULTURE: NO GROWTH
CULTURE: NO GROWTH

## 2015-09-03 ENCOUNTER — Encounter (HOSPITAL_COMMUNITY): Payer: Self-pay | Admitting: Emergency Medicine

## 2015-09-03 ENCOUNTER — Emergency Department (HOSPITAL_COMMUNITY): Payer: Medicare Other

## 2015-09-03 ENCOUNTER — Emergency Department (HOSPITAL_COMMUNITY)
Admission: EM | Admit: 2015-09-03 | Discharge: 2015-09-04 | Disposition: A | Discharge status: Home / Self Care | Payer: Medicare Other | Attending: Emergency Medicine | Admitting: Emergency Medicine

## 2015-09-03 DIAGNOSIS — Z7982 Long term (current) use of aspirin: Secondary | ICD-10-CM | POA: Insufficient documentation

## 2015-09-03 DIAGNOSIS — Z79899 Other long term (current) drug therapy: Secondary | ICD-10-CM | POA: Insufficient documentation

## 2015-09-03 DIAGNOSIS — R630 Anorexia: Secondary | ICD-10-CM | POA: Insufficient documentation

## 2015-09-03 DIAGNOSIS — Z8742 Personal history of other diseases of the female genital tract: Secondary | ICD-10-CM | POA: Insufficient documentation

## 2015-09-03 DIAGNOSIS — I10 Essential (primary) hypertension: Secondary | ICD-10-CM | POA: Diagnosis not present

## 2015-09-03 DIAGNOSIS — Z792 Long term (current) use of antibiotics: Secondary | ICD-10-CM | POA: Diagnosis not present

## 2015-09-03 DIAGNOSIS — R531 Weakness: Secondary | ICD-10-CM | POA: Diagnosis not present

## 2015-09-03 DIAGNOSIS — R197 Diarrhea, unspecified: Secondary | ICD-10-CM | POA: Diagnosis not present

## 2015-09-03 DIAGNOSIS — D649 Anemia, unspecified: Secondary | ICD-10-CM | POA: Insufficient documentation

## 2015-09-03 DIAGNOSIS — R4182 Altered mental status, unspecified: Secondary | ICD-10-CM | POA: Diagnosis present

## 2015-09-03 DIAGNOSIS — F0390 Unspecified dementia without behavioral disturbance: Secondary | ICD-10-CM | POA: Insufficient documentation

## 2015-09-03 DIAGNOSIS — I69398 Other sequelae of cerebral infarction: Secondary | ICD-10-CM | POA: Insufficient documentation

## 2015-09-03 DIAGNOSIS — Z87440 Personal history of urinary (tract) infections: Secondary | ICD-10-CM | POA: Diagnosis not present

## 2015-09-03 LAB — CBC WITH DIFFERENTIAL/PLATELET
BASOS PCT: 0 %
Basophils Absolute: 0 10*3/uL (ref 0.0–0.1)
EOS ABS: 0.2 10*3/uL (ref 0.0–0.7)
EOS PCT: 2 %
HCT: 33 % — ABNORMAL LOW (ref 36.0–46.0)
HEMOGLOBIN: 10.6 g/dL — AB (ref 12.0–15.0)
LYMPHS ABS: 3 10*3/uL (ref 0.7–4.0)
Lymphocytes Relative: 37 %
MCH: 27 pg (ref 26.0–34.0)
MCHC: 32.1 g/dL (ref 30.0–36.0)
MCV: 84.2 fL (ref 78.0–100.0)
MONO ABS: 0.5 10*3/uL (ref 0.1–1.0)
MONOS PCT: 6 %
NEUTROS PCT: 55 %
Neutro Abs: 4.5 10*3/uL (ref 1.7–7.7)
PLATELETS: 417 10*3/uL — AB (ref 150–400)
RBC: 3.92 MIL/uL (ref 3.87–5.11)
RDW: 17.1 % — AB (ref 11.5–15.5)
WBC: 8.2 10*3/uL (ref 4.0–10.5)

## 2015-09-03 LAB — I-STAT CG4 LACTIC ACID, ED: LACTIC ACID, VENOUS: 0.95 mmol/L (ref 0.5–2.0)

## 2015-09-03 MED ORDER — SODIUM CHLORIDE 0.9 % IV BOLUS (SEPSIS)
1000.0000 mL | Freq: Once | INTRAVENOUS | Status: AC
Start: 2015-09-03 — End: 2015-09-04
  Administered 2015-09-04: 1000 mL via INTRAVENOUS

## 2015-09-03 NOTE — ED Provider Notes (Signed)
CSN: [id number]     Arrival date & time 09/03/15  2259 History  By signing my name below, I, Octavia Heir, attest that this documentation has been prepared under the direction and in the presence of Lavera Guise, MD. Electronically Signed: Octavia Heir, ED Scribe. 09/04/2015. 12:02 AM.     Chief Complaint  Patient presents with  . Altered Mental Status      The history is provided by a caregiver. No language interpreter was used.   HPI Comments: Jamie Valentine is a 81 y.o. female who has a hx of HTN, Dementia and prior CVA with residual left sided weakness. She was brought in by EMS presents to the Emergency Department with concern for AMS. Caregiver states the pt has been on antibiotics for the past 5 days for UTI and has not been able to take them tonight with 5 days remaining. States that she tried to give her a dose of her macrobid tonight, but patient did not swallow the pill. She states that she tried to urge the patient to take the pill, but she was refusing to make eye contact. She appeared to stare upwards. She reports that pt typically has AMS after having UTI. She called EMS, and on EMS arrival, patient still was not talking.  Pt has been having gradual worsening weakness since Feb, but seem to be more this week with the diagnosis of her UTI by her PCP. Caregiver states pt has been having associated diarrhea and loss of appetite. States diarrhea worse when starting antibiotic, but getting better now.  Per caregiver, pt has been very weak to the extent to where she doesn't talk that much. Pt is bedridden since having multiple strokes. Pt is unable to walk and has generalized weakness all over her body typically.  Pt has a foley catheter that was last changed on December 19th. Caregiver denies fever, vomiting, nausea, abdominal pain, dysuria, hematuria, coughing, shortness of breath, loss of consciousness.  Past Medical History  Diagnosis Date  . Stroke (HCC)   . Cystitis   .  Diabetes mellitus without complication (HCC)   . UTI (urinary tract infection)   . Dementia   . Anemia   . Hypertension    History reviewed. No pertinent past surgical history. History reviewed. No pertinent family history. Social History  Substance Use Topics  . Smoking status: Never Smoker   . Smokeless tobacco: None  . Alcohol Use: No   OB History    No data available     Review of Systems  Constitutional: Positive for appetite change. Negative for fever.  Respiratory: Negative for shortness of breath.   Gastrointestinal: Positive for diarrhea. Negative for nausea and vomiting.  Genitourinary: Negative for dysuria, frequency and hematuria.  All other systems reviewed and are negative.     Allergies  Ciprofloxacin; Contrast media; Levaquin; Macrobid; and Sulfa antibiotics  Home Medications   Prior to Admission medications   Medication Sig Start Date End Date Taking? Authorizing Provider  amLODipine (NORVASC) 5 MG tablet Take 5 mg by mouth daily.    Historical Provider, MD  aspirin EC 81 MG tablet Take 81 mg by mouth daily.    Historical Provider, MD  cephALEXin (KEFLEX) 500 MG capsule Take 1 capsule (500 mg total) by mouth 2 (two) times daily. 06/02/15   Kristen N Ward, DO  FERROUS SULFATE PO Take 65 mg by mouth.    Historical Provider, MD  losartan (COZAAR) 25 MG tablet Take 25 mg by mouth  daily.    Historical Provider, MD  METFORMIN HCL ER PO Take 1,000 mg by mouth.    Historical Provider, MD  mirtazapine (REMERON) 30 MG tablet Take 30 mg by mouth at bedtime.    Historical Provider, MD  pantoprazole (PROTONIX) 40 MG tablet Take 40 mg by mouth daily.    Historical Provider, MD  pravastatin (PRAVACHOL) 40 MG tablet Take 40 mg by mouth daily.    Historical Provider, MD  traMADol (ULTRAM) 50 MG tablet Take 1 tablet (50 mg total) by mouth every 6 (six) hours as needed. 06/02/15   Kristen N Ward, DO  Vitamin D, Ergocalciferol, (DRISDOL) 50000 UNITS CAPS capsule Take 50,000  Units by mouth every 7 (seven) days.    Historical Provider, MD   Triage vitals: BP 180/81 mmHg  Pulse 68  Temp(Src) 98.7 F (37.1 C) (Rectal)  Resp 14  Ht  (1.6 m)  Wt 175 lb (79.379 kg)  BMI 31.01 kg/m2  SpO2 100% Physical Exam Physical Exam  Nursing note and vitals reviewed. Constitutional: Well developed, well nourished, non-toxic, and in no acute distress Head: Normocephalic and atraumatic.  Mouth/Throat: Oropharynx is clear and moist.  Neck: Normal range of motion. Neck supple.  Cardiovascular: Normal rate and regular rhythm.   Pulmonary/Chest: Effort normal and breath sounds normal.  Abdominal: Soft. There is no tenderness. There is no rebound and no guarding.  Musculoskeletal: Normal range of motion.  Neurological: Alert, no facial droop, fluent speech, moves all extremities symmetrically to command although with general weakness, left hand grip slightly weaker than the right Skin: Skin is warm and dry.  Psychiatric: Cooperative  ED Course  Procedures  DIAGNOSTIC STUDIES: Oxygen Saturation is 100% on RA, normal by my interpretation.  COORDINATION OF CARE:  11:32 PM Discussed treatment plan which includes CT head with caregiver at bedside and she agreed to plan.  Labs Review Labs Reviewed  CBC WITH DIFFERENTIAL/PLATELET - Abnormal; Notable for the following:    Hemoglobin 10.6 (*)    HCT 33.0 (*)    RDW 17.1 (*)    Platelets 417 (*)    All other components within normal limits  COMPREHENSIVE METABOLIC PANEL - Abnormal; Notable for the following:    Glucose, Bld 109 (*)    Total Protein 6.0 (*)    Albumin 2.4 (*)    Total Bilirubin 0.2 (*)    All other components within normal limits  URINALYSIS, ROUTINE W REFLEX MICROSCOPIC (NOT AT [facility]) - Abnormal; Notable for the following:    Leukocytes, UA MODERATE (*)    All other components within normal limits  URINE MICROSCOPIC-ADD ON - Abnormal; Notable for the following:    Squamous Epithelial / LPF 0-5 (*)     Bacteria, UA FEW (*)    All other components within normal limits  URINE CULTURE  I-STAT CG4 LACTIC ACID, ED  I-STAT CG4 LACTIC ACID, ED    Imaging Review Dg Chest 2 View  09/04/2015  CLINICAL DATA:  Initial evaluation for acute altered mental status. EXAM: CHEST  2 VIEW COMPARISON:  Prior study from 06/28/2015. FINDINGS: Cardiac and mediastinal silhouettes are stable, and remain within normal limits. Mild tortuosity of the intrathoracic aorta. Lungs are hypoinflated, similar to prior. No focal infiltrate, pulmonary edema, or pleural effusion. No pneumothorax. Lateral view limited due to patient positioning. No acute osseus abnormality. IMPRESSION: No active cardiopulmonary disease. Electronically Signed   By: Rise Mu M.D.   On: 09/04/2015 00:51   Ct Head Wo Contrast  09/04/2015  CLINICAL DATA:  81 year old female with altered mental status and weakness. EXAM: CT HEAD WITHOUT CONTRAST TECHNIQUE: Contiguous axial images were obtained from the base of the skull through the vertex without intravenous contrast. COMPARISON:  Head CT dated 08/05/2015 FINDINGS: There is state dilatation of the ventricles out of proportion with the sulci which may represent central volume loss versus normal pressure hydrocephalus. Clinical correlation is recommended. Periventricular and deep white matter hypodensities represent chronic microvascular ischemic changes. There is no intracranial hemorrhage. No mass effect or midline shift identified. The visualized paranasal sinuses and mastoid air cells are well aerated. The calvarium is intact. IMPRESSION: No acute intracranial hemorrhage. Age-related atrophy and chronic microvascular ischemic disease. Findings concerning for normal pressure hydrocephalus. Clinical correlation is recommended. If symptoms persist and there are no contraindications, MRI may provide better evaluation if clinically indicated. Electronically Signed   By: Elgie CollardArash  Radparvar M.D.   On:  09/04/2015 01:04   I have personally reviewed and evaluated these images and lab results as part of my medical decision-making.   EKG Interpretation   Date/Time:  Thursday September 03 2015 23:36:24 EST Ventricular Rate:  61 PR Interval:  87 QRS Duration: 74 QT Interval:  385 QTC Calculation: 388 R Axis:   -22 Text Interpretation:  Age not entered, assumed to be  81 years old for  purpose of ECG interpretation Sinus rhythm Short PR interval Inferior  infarct, old Baseline wander in lead(s) II III aVR aVL aVF V1 V2 V3 V4 V5  V6 No prior EKG for comparison Confirmed by Tryton Bodi MD, Rekita Miotke ([id number]) on  09/04/2015 12:13:09 AM      MDM   Final diagnoses:  Weakness    In short, this is an 81 year old female with history of dementia and prior CVA who presents with concern for altered mental status. On my evaluation of this patient, patient is alert and answering questions appropriately. She is at her mental status baseline currently according to her caregiver. On further questioning the patient, she states that during  the incident of her reported altered mental status, that she was aware of what was going on. States that she had not wanted to swallow her antibiotic and was pretending not to listen to her caregiver. Given the patient is aware of what was going on, presentation does not seem truly consistent with that of altered mental status.   CT scan of her head shows no acute intracranial processes. She has no evidence of systemic infection, and no evidence of electrolyte or metabolic derangements. Chest x-ray without acute cardiopulmonary processes. Urinalysis with some bacteria and leukocyte esterase, and is sent for culture. Currently on a course of Macrobid, which she has been found to be sensitive to in the past. Has asked her to continue taking this. Patient at this time is felt to be stable for discharge home. I have reviewed strict return and follow-up instructions with the caregiver. She  expressed understanding of all discharge instructions and felt comfortable to plan of care.   Lavera Guiseana Duo Callen Zuba, MD 09/04/15 [id number]

## 2015-09-03 NOTE — ED Notes (Signed)
Pt. Is from home and has been on abx for a UTI for 5 days and has 5 days left of her abx treatment. Pt has a foley due to past stroke 3 years ago. Pt is bedridden since stroke and has foley for the past 3 years. Pt was nonverbal on EMS arrival and now pt. Is starting to respond to stimuli

## 2015-09-04 ENCOUNTER — Emergency Department (HOSPITAL_COMMUNITY): Payer: Medicare Other

## 2015-09-04 DIAGNOSIS — I69398 Other sequelae of cerebral infarction: Secondary | ICD-10-CM | POA: Diagnosis not present

## 2015-09-04 LAB — COMPREHENSIVE METABOLIC PANEL
ALBUMIN: 2.4 g/dL — AB (ref 3.5–5.0)
ALT: 17 U/L (ref 14–54)
ANION GAP: 7 (ref 5–15)
AST: 19 U/L (ref 15–41)
Alkaline Phosphatase: 70 U/L (ref 38–126)
BUN: 12 mg/dL (ref 6–20)
CHLORIDE: 109 mmol/L (ref 101–111)
CO2: 23 mmol/L (ref 22–32)
Calcium: 9.8 mg/dL (ref 8.9–10.3)
Creatinine, Ser: 0.65 mg/dL (ref 0.44–1.00)
GFR calc non Af Amer: >60 mL/min (ref >60)
Glucose, Bld: 109 mg/dL — ABNORMAL HIGH (ref 65–99)
Potassium: 3.9 mmol/L (ref 3.5–5.1)
SODIUM: 139 mmol/L (ref 135–145)
Total Bilirubin: 0.2 mg/dL — ABNORMAL LOW (ref 0.3–1.2)
Total Protein: 6 g/dL — ABNORMAL LOW (ref 6.5–8.1)

## 2015-09-04 LAB — URINALYSIS, ROUTINE W REFLEX MICROSCOPIC
Bilirubin Urine: NEGATIVE
Glucose, UA: NEGATIVE mg/dL
HGB URINE DIPSTICK: NEGATIVE
Ketones, ur: NEGATIVE mg/dL
Nitrite: NEGATIVE
PH: 7 (ref 5.0–8.0)
Protein, ur: NEGATIVE mg/dL
SPECIFIC GRAVITY, URINE: 1.008 (ref 1.005–1.030)

## 2015-09-04 LAB — URINE MICROSCOPIC-ADD ON: RBC / HPF: NONE SEEN RBC/hpf (ref 0–5)

## 2015-09-04 NOTE — ED Notes (Signed)
Pt fed half a Malawiturkey sandwich and given applejuice.

## 2015-09-04 NOTE — ED Notes (Signed)
Pt departing [facility] via [facility] to home at this time

## 2015-09-04 NOTE — Discharge Instructions (Signed)
Please continue antibiotics as prescribed by your physician. Return without fail for worsening symptoms, including fever, confusion, severe pain, difficulty breathing or any other symptoms concerning to you.  Weakness Weakness is a lack of strength. You may feel weak all over your body or just in one part of your body. Weakness can be serious. In some cases, you may need more medical tests. HOME CARE  Rest.  Eat a well-balanced diet.  Try to exercise every day.  Only take medicines as told by your doctor. GET HELP RIGHT AWAY IF:   You cannot do your normal daily activities.  You cannot walk up and down stairs, or you feel very tired when you do so.  You have shortness of breath or chest pain.  You have trouble moving parts of your body.  You have weakness in only one body part or on only one side of the body.  You have a fever.  You have trouble speaking or swallowing.  You cannot control when you pee (urinate) or poop (bowel movement).  You have black or bloody throw up (vomit) or poop.  Your weakness gets worse or spreads to other body parts.  You have new aches or pains. MAKE SURE YOU:   Understand these instructions.  Will watch your condition.  Will get help right away if you are not doing well or get worse.   This information is not intended to replace advice given to you by your health care provider. Make sure you discuss any questions you have with your health care provider.   Document Released: 08/11/2008 Document Revised: 02/28/2012 Document Reviewed: 10/28/2011 Elsevier Interactive Patient Education [employer]2016 Elsevier Inc.

## 2015-09-04 NOTE — ED Notes (Signed)
[facility] called  

## 2015-09-06 LAB — URINE CULTURE

## 2015-09-07 ENCOUNTER — Telehealth: Payer: Self-pay | Admitting: *Deleted

## 2015-09-08 ENCOUNTER — Emergency Department (HOSPITAL_COMMUNITY)
Admission: EM | Admit: 2015-09-08 | Discharge: 2015-09-08 | Disposition: A | Discharge status: Home / Self Care | Payer: Medicare Other | Attending: Physician Assistant | Admitting: Physician Assistant

## 2015-09-08 ENCOUNTER — Telehealth (HOSPITAL_COMMUNITY): Payer: Self-pay

## 2015-09-08 ENCOUNTER — Emergency Department (HOSPITAL_COMMUNITY): Payer: Medicare Other

## 2015-09-08 ENCOUNTER — Encounter (HOSPITAL_COMMUNITY): Payer: Self-pay | Admitting: Emergency Medicine

## 2015-09-08 DIAGNOSIS — Z87448 Personal history of other diseases of urinary system: Secondary | ICD-10-CM | POA: Diagnosis not present

## 2015-09-08 DIAGNOSIS — R627 Adult failure to thrive: Secondary | ICD-10-CM | POA: Diagnosis present

## 2015-09-08 DIAGNOSIS — D649 Anemia, unspecified: Secondary | ICD-10-CM | POA: Diagnosis not present

## 2015-09-08 DIAGNOSIS — F0390 Unspecified dementia without behavioral disturbance: Secondary | ICD-10-CM | POA: Insufficient documentation

## 2015-09-08 DIAGNOSIS — Z880 Allergy status to penicillin: Secondary | ICD-10-CM | POA: Diagnosis not present

## 2015-09-08 DIAGNOSIS — Z742 Need for assistance at home and no other household member able to render care: Secondary | ICD-10-CM | POA: Diagnosis not present

## 2015-09-08 DIAGNOSIS — Z7982 Long term (current) use of aspirin: Secondary | ICD-10-CM | POA: Diagnosis not present

## 2015-09-08 DIAGNOSIS — Z79899 Other long term (current) drug therapy: Secondary | ICD-10-CM | POA: Insufficient documentation

## 2015-09-08 DIAGNOSIS — Z87440 Personal history of urinary (tract) infections: Secondary | ICD-10-CM | POA: Insufficient documentation

## 2015-09-08 DIAGNOSIS — Z792 Long term (current) use of antibiotics: Secondary | ICD-10-CM | POA: Diagnosis not present

## 2015-09-08 DIAGNOSIS — Z91040 Latex allergy status: Secondary | ICD-10-CM | POA: Diagnosis not present

## 2015-09-08 DIAGNOSIS — E119 Type 2 diabetes mellitus without complications: Secondary | ICD-10-CM | POA: Insufficient documentation

## 2015-09-08 DIAGNOSIS — I10 Essential (primary) hypertension: Secondary | ICD-10-CM | POA: Diagnosis not present

## 2015-09-08 DIAGNOSIS — Z8673 Personal history of transient ischemic attack (TIA), and cerebral infarction without residual deficits: Secondary | ICD-10-CM | POA: Diagnosis not present

## 2015-09-08 DIAGNOSIS — Z794 Long term (current) use of insulin: Secondary | ICD-10-CM | POA: Diagnosis not present

## 2015-09-08 LAB — COMPREHENSIVE METABOLIC PANEL
ALK PHOS: 75 U/L (ref 38–126)
ALT: 17 U/L (ref 14–54)
ANION GAP: 8 (ref 5–15)
AST: 17 U/L (ref 15–41)
Albumin: 2.8 g/dL — ABNORMAL LOW (ref 3.5–5.0)
BILIRUBIN TOTAL: 0.4 mg/dL (ref 0.3–1.2)
BUN: 13 mg/dL (ref 6–20)
CALCIUM: 9.8 mg/dL (ref 8.9–10.3)
CO2: 22 mmol/L (ref 22–32)
CREATININE: 0.66 mg/dL (ref 0.44–1.00)
Chloride: 111 mmol/L (ref 101–111)
Glucose, Bld: 104 mg/dL — ABNORMAL HIGH (ref 65–99)
Potassium: 3.6 mmol/L (ref 3.5–5.1)
Sodium: 141 mmol/L (ref 135–145)
TOTAL PROTEIN: 6.7 g/dL (ref 6.5–8.1)

## 2015-09-08 LAB — CBC
HEMATOCRIT: 30.9 % — AB (ref 36.0–46.0)
HEMOGLOBIN: 10 g/dL — AB (ref 12.0–15.0)
MCH: 27.4 pg (ref 26.0–34.0)
MCHC: 32.4 g/dL (ref 30.0–36.0)
MCV: 84.7 fL (ref 78.0–100.0)
Platelets: 405 10*3/uL — ABNORMAL HIGH (ref 150–400)
RBC: 3.65 MIL/uL — ABNORMAL LOW (ref 3.87–5.11)
RDW: 17.1 % — ABNORMAL HIGH (ref 11.5–15.5)
WBC: 8.9 10*3/uL (ref 4.0–10.5)

## 2015-09-08 NOTE — ED Notes (Signed)
Bed: WA03 Expected date:  Expected time:  Means of arrival:  Comments: FTT

## 2015-09-08 NOTE — Progress Notes (Addendum)
   09/08/15 0000  CM Assessment  Expected Discharge Plan Home w Home Health Services  Discharge Planning Services CM Consult  Choice offered to / list presented to  Adult Children;Patient  Discharge Disposition Home w Home Health Services   ED CM consulted after ED PA/NP evaluated pt and presented plan of care to family Present at pt's bedside is her primary caregiver and her husband who pt lives with She states pt has 10 children 7 daughters and 3 sons but she is the primary caregiver and many of the children do not visit or participate in pt care.  While Cm in pt room Ed registration staff came in to state another daughter wanted to come visit the pt.  The daughter at the bedside asked "who?"  And when the other daughter arrived the care giver told Cm she had to leave. When Cm and care giver got out side of pt room Caregiver informed Cm she could not believe her sister [place] in [place] was [facility] and would not come visit her at their home. Voiced her frustration. Care giver plan is to call pcp herself to [facility] Reports [facility] staff "come and don't come when they want to" She also states pt is already receiving "eighteen hours of CAPs and it will soon be forty hours"  States pcp assisted with setting up these services for the pt. Refused for ED Cm to assist with home health, PACE or senior services.  States [facility] sent in a SW that talked to her about respite but respite snf in [place]  and she does not want pt there States pt was in 2 snfs and preference is not to place her in another snf but to keep her home.  [facility] recommendation of [facility] Speech therapist "to be admitted" Cm discussed that a Speech therapist is not a MD  CM updated ED NP/PA on plan of care for d/c home with assist for services from pcp per caregiver Denies need of DMe

## 2015-09-08 NOTE — Discharge Instructions (Signed)
Please read attached information. If you experience any new or worsening signs or symptoms please return to the emergency room for evaluation. Please follow-up with your primary care provider or specialist as discussed. Please use medication prescribed only as directed and discontinue taking if you have any concerning signs or symptoms.   °

## 2015-09-08 NOTE — ED Notes (Signed)
Patient's family reports that the patient is not eating , drinking or taking her medications.

## 2015-09-08 NOTE — Telephone Encounter (Signed)
Post ED Visit - Positive Culture Follow-up: Successful Patient Follow-Up  Culture assessed and recommendations reviewed by: []  Enzo BiNathan Batchelder, Pharm.D. []  Celedonio MiyamotoJeremy Frens, Pharm.D., BCPS []  Garvin FilaMike Maccia, Pharm.D. []  Georgina PillionElizabeth Martin, [address]Ph[address]D., BCPS []  CastleberryMinh Pham, [address]Pharm[address]D., BCPS, AAHIVP []  Estella HuskMichelle Turner, Pharm.D., BCPS, AAHIVP []  Tennis Mustassie Stewart, Pharm.D. []  Rob Oswaldo DoneVincent, [address]  Positive urine culture, >/= 100,000 colonies -> acinetobacter calcoaceticus/baumannii complex  []  Patient discharged without antimicrobial prescription and treatment is now indicated []  Organism is resistant to prescribed ED discharge antimicrobial []  Patient with positive blood cultures  Changes discussed with ED provider: [place]  Symptom check if symptomatic call in Rx below New antibiotic prescription Fosfomycin 3 gram 1 packet q 3 days x 2 doses Called to N/A  Contacted patient, date 09/07/2015, time 13:46 symptoms resolved per caregiver (K.Robertson RN)  Chart appended   Arvid RightClark, Arsal Tappan Dorn 31-Oct-202016, 2:32 PM

## 2015-09-08 NOTE — ED Provider Notes (Signed)
CSN: [id number]     Arrival date & time 09/08/15  1312 History   First MD Initiated Contact with Patient 09/08/15 1408     Chief Complaint  Patient presents with  . Failure To Thrive    HPI    81 year old female presents today with her daughter reports that patient won't eat or drink or take her medication. Initially entering the room I spoke with the patient who reports she did eat this morning, has been drinking, and has no complaints. Daughter reports that patient will not eat or drink anything. Upon further questioning she reports that she will eat small amounts, and sips very little amount of water. When asked about in-home health care or nursing home placement she reports that occupational therapy comes up to see the patient, she has an aide that comes out for a few hours every day, but occupational therapy at her last visit on Friday. She would like orders for further services at home. She denies any fever, chills, nausea, vomiting, diarrhea, foul-smelling or color changes to her urine.    Past Medical History  Diagnosis Date  . Stroke (HCC)   . Cystitis   . Diabetes mellitus without complication (HCC)   . UTI (urinary tract infection)   . Dementia   . Anemia   . Hypertension    History reviewed. No pertinent past surgical history. History reviewed. No pertinent family history. Social History  Substance Use Topics  . Smoking status: Never Smoker   . Smokeless tobacco: None  . Alcohol Use: No   OB History    No data available     Review of Systems  All other systems reviewed and are negative.   Allergies  Ciprofloxacin; Contrast media; Levaquin; Macrobid; Penicillins; Sulfa antibiotics; Amoxicillin-pot clavulanate; and Latex  Home Medications   Prior to Admission medications   Medication Sig Start Date End Date Taking? Authorizing Provider  acetaminophen (TYLENOL) 650 MG CR tablet Take 650 mg by mouth every 8 (eight) hours as needed for pain.   Yes Historical  Provider, MD  aspirin EC 81 MG tablet Take 81 mg by mouth daily.   Yes Historical Provider, MD  diphenhydrAMINE (BENADRYL) 12.5 MG/5ML liquid Take 25 mg by mouth 2 (two) times daily as needed for itching.   Yes Historical Provider, MD  ferrous sulfate 325 (65 FE) MG EC tablet Take 325 mg by mouth daily with breakfast.   Yes Historical Provider, MD  gabapentin (NEURONTIN) 100 MG capsule Take 100 mg by mouth 3 (three) times daily.   Yes Historical Provider, MD  insulin glargine (LANTUS) 100 UNIT/ML injection Inject 10-20 Units into the skin at bedtime as needed (high blood sugar). Sliding scale   Yes Historical Provider, MD  losartan (COZAAR) 100 MG tablet Take 100 mg by mouth daily.   Yes Historical Provider, MD  megestrol (MEGACE) 40 MG/ML suspension Take 400 mg by mouth 2 (two) times daily.   Yes Historical Provider, MD  metoprolol tartrate (LOPRESSOR) 25 MG tablet Take 25 mg by mouth 2 (two) times daily.  08/12/15  Yes Historical Provider, MD  nitrofurantoin, macrocrystal-monohydrate, (MACROBID) 100 MG capsule Take 100 mg by mouth 2 (two) times daily.   Yes Historical Provider, MD  ondansetron (ZOFRAN-ODT) 4 MG disintegrating tablet Take 4 mg by mouth every 8 (eight) hours as needed for nausea or vomiting.   Yes Historical Provider, MD  oxyCODONE-acetaminophen (PERCOCET/ROXICET) 5-325 MG tablet Take 1 tablet by mouth every 8 (eight) hours as needed for severe pain.  Yes Historical Provider, MD  pantoprazole (PROTONIX) 40 MG tablet Take 40 mg by mouth daily.   Yes Historical Provider, MD  tamsulosin (FLOMAX) 0.4 MG CAPS capsule Take 0.4 mg by mouth daily.   Yes Historical Provider, MD  traMADol (ULTRAM) 50 MG tablet Take 1 tablet (50 mg total) by mouth every 6 (six) hours as needed. 06/02/15  Yes Kristen N Ward, DO  triamcinolone cream (KENALOG) 0.5 % Apply 1 application topically daily as needed (rash).   Yes Historical Provider, MD  Vitamin D, Ergocalciferol, (DRISDOL) 50000 UNITS CAPS capsule  Take 50,000 Units by mouth every 7 (seven) days.   Yes Historical Provider, MD  cephALEXin (KEFLEX) 500 MG capsule Take 1 capsule (500 mg total) by mouth 2 (two) times daily. 06/02/15   Kristen N Ward, DO   BP 167/89 mmHg  Pulse 75  Temp(Src) 98.2 F (36.8 C) (Oral)  Resp 14  SpO2 100%   Physical Exam  Constitutional: She is oriented to person, place, and time. She appears well-developed and well-nourished. No distress.  HENT:  Head: Normocephalic and atraumatic.  Mouth/Throat: Uvula is midline and oropharynx is clear and moist.  Eyes: Conjunctivae are normal. Pupils are equal, round, and reactive to light. Right eye exhibits no discharge. Left eye exhibits no discharge. No scleral icterus.  Neck: Normal range of motion. No JVD present. No tracheal deviation present.  Pulmonary/Chest: Effort normal and breath sounds normal. No stridor. No respiratory distress. She has no wheezes. She has no rales. She exhibits no tenderness.  Abdominal: Soft. Bowel sounds are normal. She exhibits no distension and no mass. There is no tenderness. There is no rebound and no guarding.  Neurological: She is alert and oriented to person, place, and time. Coordination normal.  Skin: Skin is warm and dry. No rash noted. No erythema. No pallor.  Healed sacral ulcer. Right sig SI joint with greater prominence in the left. No signs of infection, no significant tenderness to palpation  Psychiatric: She has a normal mood and affect. Her behavior is normal.  Nursing note and vitals reviewed.   ED Course  Procedures (including critical care time) Labs Review Labs Reviewed  CBC - Abnormal; Notable for the following:    RBC 3.65 (*)    Hemoglobin 10.0 (*)    HCT 30.9 (*)    RDW 17.1 (*)    Platelets 405 (*)    All other components within normal limits  COMPREHENSIVE METABOLIC PANEL - Abnormal; Notable for the following:    Glucose, Bld 104 (*)    Albumin 2.8 (*)    All other components within normal limits   URINALYSIS, ROUTINE W REFLEX MICROSCOPIC (NOT AT [facility])    Imaging Review No results found. I have personally reviewed and evaluated these images and lab results as part of my medical decision-making.   EKG Interpretation None      MDM   Final diagnoses:  Needs assistance while at home    Labs: CBC, CMP-   Imaging:  Consults:  Therapeutics:  Discharge Meds:   Assessment/Plan: 81 year old female presents today with complaints of low by mouth intake. Patient's daughter would like more at home services to help take care of her mother as she refuses to go into a nursing facility. I contacted case management spoke with the patient, the daughter. Please see care management note for full details; patient has sufficient resources at home with close primary care contact. Patient appears to be in no acute distress here, with moist  mucous membranes and no significant findings on basic labs. Patient is afebrile, with no acute findings that would necessitate further evaluation or management here in the ED or on the hospital floor. Family is agreeable to outpatient management with close primary care follow-up with in home health services. Daughter has concerns for abnormality of the right posterior hip, no significant infectious findings, no significant pain to palpation. Hip films ordered for reassurance of family and primary care provider. I have low suspicion for any sort of significant abnormality. They're given strict return precautions. Patient care transferred oncoming provider pending plain films. Patient will be discharged as long as no significant findings were noted. Family understood and agreed to today's plan and had no further questions concerns the time of discharge        Eyvonne Mechanic, PA-C 09/11/15 0740  Jerelyn Scott, MD 09/11/15 [id number]

## 2015-09-08 NOTE — ED Provider Notes (Signed)
Care assumed from previous provider, PA Hedges; case discussed, plan agreed upon. Per PA Hedges, hip xray still pending at discharge. If xray with no acute abnlities, safe to discharge home. Patient was seen by me at shift change and I agree with dispo plan.   Hip xray showed mild degenerative changes with no acute abnormalities. Results of imaging given to patient and family. Patient to be discharged in stable condition.   Chase PicketJaime Pilcher Ward, PA-C 09/08/15 1736  Courteney Randall AnLyn Mackuen, MD 09/08/15 [phone]

## 2015-09-08 NOTE — ED Notes (Signed)
[facility] notified of need for transportation back to daughter's home-[address] [place]

## 2015-09-08 NOTE — ED Notes (Signed)
Pt is from home.  Pt is refusing to eat, drink or take meds.  Pt has chronic foley catheter.  Pt is alert to person and place only.  Per family this is baseline for Pt.  BP 152/90 P 72 RR 18 100% RA  CBG 148.  Pt is diabetic.  Hx of CVA but family unaware of what deficits resulted.

## 2015-09-21 ENCOUNTER — Encounter (HOSPITAL_BASED_OUTPATIENT_CLINIC_OR_DEPARTMENT_OTHER): Payer: Self-pay | Admitting: Emergency Medicine

## 2015-09-21 ENCOUNTER — Emergency Department (HOSPITAL_BASED_OUTPATIENT_CLINIC_OR_DEPARTMENT_OTHER)
Admission: EM | Admit: 2015-09-21 | Discharge: 2015-09-21 | Disposition: A | Discharge status: Home / Self Care | Payer: Medicare Other | Attending: Emergency Medicine | Admitting: Emergency Medicine

## 2015-09-21 DIAGNOSIS — Z792 Long term (current) use of antibiotics: Secondary | ICD-10-CM | POA: Insufficient documentation

## 2015-09-21 DIAGNOSIS — Z87448 Personal history of other diseases of urinary system: Secondary | ICD-10-CM | POA: Diagnosis not present

## 2015-09-21 DIAGNOSIS — Z880 Allergy status to penicillin: Secondary | ICD-10-CM | POA: Insufficient documentation

## 2015-09-21 DIAGNOSIS — Z87440 Personal history of urinary (tract) infections: Secondary | ICD-10-CM | POA: Insufficient documentation

## 2015-09-21 DIAGNOSIS — Z7982 Long term (current) use of aspirin: Secondary | ICD-10-CM | POA: Insufficient documentation

## 2015-09-21 DIAGNOSIS — Z91040 Latex allergy status: Secondary | ICD-10-CM | POA: Diagnosis not present

## 2015-09-21 DIAGNOSIS — R339 Retention of urine, unspecified: Secondary | ICD-10-CM | POA: Diagnosis present

## 2015-09-21 DIAGNOSIS — I10 Essential (primary) hypertension: Secondary | ICD-10-CM | POA: Insufficient documentation

## 2015-09-21 DIAGNOSIS — D649 Anemia, unspecified: Secondary | ICD-10-CM | POA: Insufficient documentation

## 2015-09-21 DIAGNOSIS — Z79899 Other long term (current) drug therapy: Secondary | ICD-10-CM | POA: Diagnosis not present

## 2015-09-21 DIAGNOSIS — Z794 Long term (current) use of insulin: Secondary | ICD-10-CM | POA: Diagnosis not present

## 2015-09-21 DIAGNOSIS — Z8673 Personal history of transient ischemic attack (TIA), and cerebral infarction without residual deficits: Secondary | ICD-10-CM | POA: Diagnosis not present

## 2015-09-21 DIAGNOSIS — E119 Type 2 diabetes mellitus without complications: Secondary | ICD-10-CM | POA: Diagnosis not present

## 2015-09-21 DIAGNOSIS — F0390 Unspecified dementia without behavioral disturbance: Secondary | ICD-10-CM | POA: Diagnosis not present

## 2015-09-21 LAB — CBC
HCT: 32 % — ABNORMAL LOW (ref 36.0–46.0)
Hemoglobin: 10.4 g/dL — ABNORMAL LOW (ref 12.0–15.0)
MCH: 26.9 pg (ref 26.0–34.0)
MCHC: 32.5 g/dL (ref 30.0–36.0)
MCV: 82.7 fL (ref 78.0–100.0)
PLATELETS: 431 10*3/uL — AB (ref 150–400)
RBC: 3.87 MIL/uL (ref 3.87–5.11)
RDW: 15.9 % — ABNORMAL HIGH (ref 11.5–15.5)
WBC: 7.6 10*3/uL (ref 4.0–10.5)

## 2015-09-21 LAB — URINALYSIS, ROUTINE W REFLEX MICROSCOPIC
BILIRUBIN URINE: NEGATIVE
GLUCOSE, UA: NEGATIVE mg/dL
Ketones, ur: NEGATIVE mg/dL
Leukocytes, UA: NEGATIVE
Nitrite: NEGATIVE
PROTEIN: 100 mg/dL — AB
SPECIFIC GRAVITY, URINE: 1.014 (ref 1.005–1.030)
pH: 7.5 (ref 5.0–8.0)

## 2015-09-21 LAB — URINE MICROSCOPIC-ADD ON: WBC, UA: NONE SEEN WBC/hpf (ref 0–5)

## 2015-09-21 LAB — BASIC METABOLIC PANEL
Anion gap: 9 (ref 5–15)
BUN: 11 mg/dL (ref 6–20)
CO2: 22 mmol/L (ref 22–32)
CREATININE: 0.56 mg/dL (ref 0.44–1.00)
Calcium: 9.4 mg/dL (ref 8.9–10.3)
Chloride: 110 mmol/L (ref 101–111)
GFR calc Af Amer: >60 mL/min (ref >60)
GLUCOSE: 143 mg/dL — AB (ref 65–99)
POTASSIUM: 3.9 mmol/L (ref 3.5–5.1)
SODIUM: 141 mmol/L (ref 135–145)

## 2015-09-21 MED ORDER — SODIUM CHLORIDE 0.9 % IV BOLUS (SEPSIS): 500.0000 mL | Freq: Once | INTRAVENOUS | Status: DC

## 2015-09-21 MED ORDER — SODIUM CHLORIDE 0.9 % IV BOLUS (SEPSIS)
1000.0000 mL | Freq: Once | INTRAVENOUS | Status: AC
  Administered 2015-09-21: 1000 mL via INTRAVENOUS

## 2015-09-21 NOTE — ED Provider Notes (Signed)
Patient care handoff from Audry Piliyler Mohr, PA-C, at shift change.  Patient presents with urinary retention x 4 days with concern for UTI.  [facility] and sent home with Macrobid.  Today adequate UO, foley changed.  CBC, BMP pending.  Anticipate d/c home. CBC shows WBC 7.6, HGB 10.4. BMP shows Cr 0.56, BUN 11.  No leukocytosis of evidence of AKI.  Patient stable for discharge home.  VSS, patient afebrile.  Patient appears well, non-toxic.  Discussed return precautions.  Follow up PCP in 2 days.  Patient agrees and acknowledges the above plan for discharge.  Cheri FowlerKayla Quynh Basso, PA-C 09/21/15 1726  Azalia BilisKevin Campos, MD 09/22/15 1126

## 2015-09-21 NOTE — ED Notes (Signed)
[facility] [place] [facility]. The patient daughter told EMS that the patient possible has a blocked catheter. The patient also possibly has a UTI per the daughter. Daughter is not here to get more information and patient is unable to discuss with the RN

## 2015-09-21 NOTE — Discharge Instructions (Signed)
Acute Urinary Retention, Female  Urinary retention means you are unable to pee completely or at all (empty your bladder).  HOME CARE  · Drink enough fluids to keep your pee (urine) clear or pale yellow.  · If you are sent home with a tube that drains the bladder (catheter), there will be a drainage bag attached to it. There are two types of bags. One is big that you can wear at night without having to empty it. One is smaller and needs to be emptied more often.    Keep the drainage bag emptied.    Keep the drainage bag lower than the tube.  · Only take medicine as told by your doctor.  GET HELP IF:  · You have a low-grade fever.  · You have spasms or you are leaking pee when you have spasms.  GET HELP RIGHT AWAY IF:   · You have chills or a fever.  · Your catheter stops draining pee.  · Your catheter falls out.  · You have increased bleeding that does not stop after you have rested and increased the amount of fluids you had been drinking.  MAKE SURE YOU:   · Understand these instructions.  · Will watch your condition.  · Will get help right away if you are not doing well or get worse.     This information is not intended to replace advice given to you by your health care provider. Make sure you discuss any questions you have with your health care provider.     Document Released: 02/15/2008 Document Revised: 01/13/2015 Document Reviewed: 02/07/2013  Elsevier Interactive Patient Education ©2016 Elsevier Inc.

## 2015-09-21 NOTE — ED Notes (Signed)
I helped get patient into bed with [facility] staff. I removed blankets and sheets patient arrived in, then helped removed sweater and dress, then shoes, then helped patient into a gown. Vitals were then taken, both female EMT's cleaned patient's perineum area of feces. Patient resting comfortabably as family member entered room and explained that she wants an all female staff if any further cleaning and dressing activities.

## 2015-09-21 NOTE — ED Provider Notes (Signed)
CSN: [id number]     Arrival date & time 09/21/15  1418 History   First MD Initiated Contact with Patient 09/21/15 1425     Chief Complaint  Patient presents with  . Urinary Retention   (Consider location/radiation/quality/duration/timing/severity/associated sxs/prior Treatment) HPI 82 y.o. female with a hx of recurrent UTI, presents to the Emergency Department today complaining of urinary retention and UTI. Pt was seen at [facility] previously and treated with Macrobid. Failure to thrive diagnosis given and placed on fluids. Daughter states that she noticed no urine output from foley catheter 4 days ago. She did not try to flush the catheter and said they never told her to do that. She attempted to contact that home health nurse who was unable to arrive today to treat the symptoms. Pt arrived via [facility]. No fevers, no chest pain, no shortness of breath, no abdominal pain. No pelvic discomfort.     Past Medical History  Diagnosis Date  . Stroke (HCC)   . Cystitis   . Diabetes mellitus without complication (HCC)   . UTI (urinary tract infection)   . Dementia   . Anemia   . Hypertension    History reviewed. No pertinent past surgical history. History reviewed. No pertinent family history. Social History  Substance Use Topics  . Smoking status: Never Smoker   . Smokeless tobacco: None  . Alcohol Use: No   OB History    No data available     Review of Systems 10 Systems reviewed and all are negative for acute change except as noted in the HPI.  Allergies  Ciprofloxacin; Contrast media; Levaquin; Macrobid; Penicillins; Sulfa antibiotics; Amoxicillin-pot clavulanate; and Latex  Home Medications   Prior to Admission medications   Medication Sig Start Date End Date Taking? Authorizing Provider  acetaminophen (TYLENOL) 650 MG CR tablet Take 650 mg by mouth every 8 (eight) hours as needed for pain.    Historical Provider, MD  aspirin EC 81 MG tablet Take 81 mg by mouth daily.     Historical Provider, MD  cephALEXin (KEFLEX) 500 MG capsule Take 1 capsule (500 mg total) by mouth 2 (two) times daily. 06/02/15   Kristen N Ward, DO  diphenhydrAMINE (BENADRYL) 12.5 MG/5ML liquid Take 25 mg by mouth 2 (two) times daily as needed for itching.    Historical Provider, MD  ferrous sulfate 325 (65 FE) MG EC tablet Take 325 mg by mouth daily with breakfast.    Historical Provider, MD  gabapentin (NEURONTIN) 100 MG capsule Take 100 mg by mouth 3 (three) times daily.    Historical Provider, MD  insulin glargine (LANTUS) 100 UNIT/ML injection Inject 10-20 Units into the skin at bedtime as needed (high blood sugar). Sliding scale    Historical Provider, MD  losartan (COZAAR) 100 MG tablet Take 100 mg by mouth daily.    Historical Provider, MD  megestrol (MEGACE) 40 MG/ML suspension Take 400 mg by mouth 2 (two) times daily.    Historical Provider, MD  metoprolol tartrate (LOPRESSOR) 25 MG tablet Take 25 mg by mouth 2 (two) times daily.  08/12/15   Historical Provider, MD  nitrofurantoin, macrocrystal-monohydrate, (MACROBID) 100 MG capsule Take 100 mg by mouth 2 (two) times daily.    Historical Provider, MD  ondansetron (ZOFRAN-ODT) 4 MG disintegrating tablet Take 4 mg by mouth every 8 (eight) hours as needed for nausea or vomiting.    Historical Provider, MD  oxyCODONE-acetaminophen (PERCOCET/ROXICET) 5-325 MG tablet Take 1 tablet by mouth every 8 (eight) hours  as needed for severe pain.    Historical Provider, MD  pantoprazole (PROTONIX) 40 MG tablet Take 40 mg by mouth daily.    Historical Provider, MD  tamsulosin (FLOMAX) 0.4 MG CAPS capsule Take 0.4 mg by mouth daily.    Historical Provider, MD  traMADol (ULTRAM) 50 MG tablet Take 1 tablet (50 mg total) by mouth every 6 (six) hours as needed. 06/02/15   Kristen N Ward, DO  triamcinolone cream (KENALOG) 0.5 % Apply 1 application topically daily as needed (rash).    Historical Provider, MD  Vitamin D, Ergocalciferol, (DRISDOL) 50000 UNITS CAPS  capsule Take 50,000 Units by mouth every 7 (seven) days.    Historical Provider, MD   BP 176/98 mmHg  Pulse 84  Temp(Src) 99.4 F (37.4 C) (Rectal)  Resp 18  SpO2 100%   Physical Exam  Constitutional: She is oriented to person, place, and time. She appears well-developed and well-nourished.  HENT:  Head: Normocephalic and atraumatic.  Eyes: EOM are normal.  Cardiovascular: Normal rate and regular rhythm.   Pulmonary/Chest: Effort normal.  Abdominal: Soft.  Genitourinary: Pelvic exam was performed with patient supine.  A chaperone was present during examination of foley catheter placement.   Musculoskeletal: Normal range of motion.  Neurological: She is alert and oriented to person, place, and time.  Skin: Skin is warm and dry.  Psychiatric: She has a normal mood and affect. Her behavior is normal. Thought content normal.  Nursing note and vitals reviewed.  ED Course  Procedures (including critical care time) Labs Review Labs Reviewed  URINE CULTURE  CBC  BASIC METABOLIC PANEL   Imaging Review No results found. I have personally reviewed and evaluated these images and lab results as part of my medical decision-making.   EKG Interpretation None     MDM  I have reviewed the relevant previous healthcare records. I have reviewed EMS Documentation. I obtained HPI from historian. Patient discussed with supervising physician  ED Course:  Assessment: [age] ear old female presents with urinary retention and suspected UTI. She was seen previously on 09/08/15 for Failure to Thrive. Noted difficulty of daughter getting help at home and refused to take mother to nursing facility. See Case Manager note on 12/27. Upon initial presentation, foley catheter showed urine output. Daughter clarified that she is concerned that her mother is not producing enough urine. Pt is afebrile. No CP/SOB/ABD pain. Otherwise asymptomatic. Obtain CBC, BMP, and urine culture for further evaluation. If white  count present, will treat with ABX paced on previous Urine Culture results. Will hydrate while she is here with 1 L NS bolus. If Cr elevated, will admit to medicine.      Disposition/Plan:  Dc Home  Additional Verbal discharge instructions given and discussed with patient.  Pt Instructed to f/u with PCP in the next 48 hours for evaluation and treatment of symptoms. Return precautions given Pt acknowledges and agrees with plan  Supervising Physician Azalia BilisKevin Campos, MD   Sign out to: Cheri FowlerKayla Rose, PA-C   Final diagnoses:  None      Audry Piliyler Margaretha Mahan, PA-C 09/21/15 1613  Azalia BilisKevin Campos, MD 09/22/15 1045

## 2015-09-24 LAB — URINE CULTURE

## 2015-09-25 ENCOUNTER — Telehealth (HOSPITAL_BASED_OUTPATIENT_CLINIC_OR_DEPARTMENT_OTHER): Payer: Self-pay | Admitting: Emergency Medicine

## 2015-09-25 NOTE — Telephone Encounter (Signed)
Post ED Visit - Positive Culture Follow-up  Culture report reviewed by antimicrobial stewardship pharmacist:  []  Jamie Valentine, Pharm.D. []  Jamie Valentine, Pharm.D., BCPS []  Jamie Valentine, Pharm.D. []  Jamie Valentine, Pharm.D., BCPS []  Jamie Valentine, [address] BCPS, AAHIVP []  Jamie Valentine, Pharm.D., BCPS, AAHIVP []  Jamie Valentine, Pharm.D. []  Jamie Valentine, [address]Ph[address]  Positive urine culture Treated with none, asymptomatic, no further patient follow-up is required at this time.  Jamie Valentine, Jamie Valentine 09/25/2015, 9:35 AM

## 2015-09-25 NOTE — Progress Notes (Signed)
ED Antimicrobial Stewardship Positive Culture Follow Up   Jamie BaconHelen L Smigelski is an 82 y.o. female who [facility] with a chief complaint of  Chief Complaint  Patient presents with  . Urinary Retention    Recent Results (from the past 720 hour(s))  Urine culture     Status: None   Collection Time: 09/03/15 11:55 PM  Result Value Ref Range Status   Specimen Description URINE, CATHETERIZED  Final   Special Requests NONE  Final   Culture   Final    >=100,000 COLONIES/mL ACINETOBACTER CALCOACETICUS/BAUMANNII COMPLEX   Report Status 09/06/2015 FINAL  Final   Organism ID, Bacteria ACINETOBACTER CALCOACETICUS/BAUMANNII COMPLEX  Final      Susceptibility   Acinetobacter calcoaceticus/baumannii complex - MIC*    CEFTAZIDIME >=64 RESISTANT Resistant     CEFTRIAXONE >=64 RESISTANT Resistant     CIPROFLOXACIN >=4 RESISTANT Resistant     GENTAMICIN <=1 SENSITIVE Sensitive     IMIPENEM 2 SENSITIVE Sensitive     PIP/TAZO >=128 RESISTANT Resistant     TRIMETH/SULFA >=320 RESISTANT Resistant     AMPICILLIN/SULBACTAM 4 SENSITIVE Sensitive     * >=100,000 COLONIES/mL ACINETOBACTER CALCOACETICUS/BAUMANNII COMPLEX  Urine culture     Status: None   Collection Time: 09/21/15  5:00 PM  Result Value Ref Range Status   Specimen Description URINE, CLEAN CATCH  Final   Special Requests NONE  Final   Culture   Final    >=100,000 COLONIES/mL ESCHERICHIA COLI Confirmed Extended Spectrum Beta-Lactamase Producer (ESBL) Performed at [facility]    Report Status 09/24/2015 FINAL  Final   Organism ID, Bacteria ESCHERICHIA COLI  Final      Susceptibility   Escherichia coli - MIC*    AMPICILLIN >=32 RESISTANT Resistant     CEFAZOLIN >=64 RESISTANT Resistant     CEFTRIAXONE 32 RESISTANT Resistant     CIPROFLOXACIN >=4 RESISTANT Resistant     GENTAMICIN <=1 SENSITIVE Sensitive     IMIPENEM <=0.25 SENSITIVE Sensitive     NITROFURANTOIN <=16 SENSITIVE Sensitive     TRIMETH/SULFA  >=320 RESISTANT Resistant     AMPICILLIN/SULBACTAM 4 SENSITIVE Sensitive     PIP/TAZO <=4 SENSITIVE Sensitive     * >=100,000 COLONIES/mL ESCHERICHIA COLI    CA-ASB, no treatment indicated. Following up w/ PCP in 48 hrs from ED visit.  ED Provider: Ailene RudStevie Barrett, PA-C   [name], PharmD Candidate

## 2015-09-28 ENCOUNTER — Emergency Department (HOSPITAL_BASED_OUTPATIENT_CLINIC_OR_DEPARTMENT_OTHER): Payer: Medicare Other

## 2015-09-28 ENCOUNTER — Encounter (HOSPITAL_BASED_OUTPATIENT_CLINIC_OR_DEPARTMENT_OTHER): Payer: Self-pay | Admitting: Emergency Medicine

## 2015-09-28 ENCOUNTER — Emergency Department (HOSPITAL_BASED_OUTPATIENT_CLINIC_OR_DEPARTMENT_OTHER)
Admission: EM | Admit: 2015-09-28 | Discharge: 2015-09-28 | Disposition: A | Discharge status: Home / Self Care | Payer: Medicare Other | Attending: Emergency Medicine | Admitting: Emergency Medicine

## 2015-09-28 DIAGNOSIS — B370 Candidal stomatitis: Secondary | ICD-10-CM | POA: Insufficient documentation

## 2015-09-28 DIAGNOSIS — I10 Essential (primary) hypertension: Secondary | ICD-10-CM | POA: Insufficient documentation

## 2015-09-28 DIAGNOSIS — Z794 Long term (current) use of insulin: Secondary | ICD-10-CM | POA: Insufficient documentation

## 2015-09-28 DIAGNOSIS — Z792 Long term (current) use of antibiotics: Secondary | ICD-10-CM | POA: Insufficient documentation

## 2015-09-28 DIAGNOSIS — R0689 Other abnormalities of breathing: Secondary | ICD-10-CM | POA: Diagnosis not present

## 2015-09-28 DIAGNOSIS — E119 Type 2 diabetes mellitus without complications: Secondary | ICD-10-CM | POA: Diagnosis not present

## 2015-09-28 DIAGNOSIS — F0390 Unspecified dementia without behavioral disturbance: Secondary | ICD-10-CM | POA: Insufficient documentation

## 2015-09-28 DIAGNOSIS — D649 Anemia, unspecified: Secondary | ICD-10-CM | POA: Insufficient documentation

## 2015-09-28 DIAGNOSIS — Z7982 Long term (current) use of aspirin: Secondary | ICD-10-CM | POA: Insufficient documentation

## 2015-09-28 DIAGNOSIS — Z87448 Personal history of other diseases of urinary system: Secondary | ICD-10-CM | POA: Insufficient documentation

## 2015-09-28 DIAGNOSIS — Z8673 Personal history of transient ischemic attack (TIA), and cerebral infarction without residual deficits: Secondary | ICD-10-CM | POA: Diagnosis not present

## 2015-09-28 DIAGNOSIS — Z79899 Other long term (current) drug therapy: Secondary | ICD-10-CM | POA: Insufficient documentation

## 2015-09-28 DIAGNOSIS — Z87440 Personal history of urinary (tract) infections: Secondary | ICD-10-CM | POA: Diagnosis not present

## 2015-09-28 DIAGNOSIS — R1310 Dysphagia, unspecified: Secondary | ICD-10-CM | POA: Diagnosis present

## 2015-09-28 LAB — CBC WITH DIFFERENTIAL/PLATELET
Basophils Absolute: 0 10*3/uL (ref 0.0–0.1)
Basophils Relative: 0 %
EOS PCT: 2 %
Eosinophils Absolute: 0.2 10*3/uL (ref 0.0–0.7)
HEMATOCRIT: 31.1 % — AB (ref 36.0–46.0)
Hemoglobin: 10.1 g/dL — ABNORMAL LOW (ref 12.0–15.0)
LYMPHS ABS: 2.9 10*3/uL (ref 0.7–4.0)
LYMPHS PCT: 30 %
MCH: 26.9 pg (ref 26.0–34.0)
MCHC: 32.5 g/dL (ref 30.0–36.0)
MCV: 82.7 fL (ref 78.0–100.0)
MONO ABS: 0.6 10*3/uL (ref 0.1–1.0)
Monocytes Relative: 6 %
Neutro Abs: 6 10*3/uL (ref 1.7–7.7)
Neutrophils Relative %: 62 %
PLATELETS: 468 10*3/uL — AB (ref 150–400)
RBC: 3.76 MIL/uL — AB (ref 3.87–5.11)
RDW: 16.2 % — AB (ref 11.5–15.5)
WBC: 9.7 10*3/uL (ref 4.0–10.5)

## 2015-09-28 LAB — BASIC METABOLIC PANEL
Anion gap: 7 (ref 5–15)
BUN: 11 mg/dL (ref 6–20)
CHLORIDE: 112 mmol/L — AB (ref 101–111)
CO2: 22 mmol/L (ref 22–32)
Calcium: 9.3 mg/dL (ref 8.9–10.3)
Creatinine, Ser: 0.61 mg/dL (ref 0.44–1.00)
GFR calc Af Amer: >60 mL/min (ref >60)
GFR calc non Af Amer: >60 mL/min (ref >60)
GLUCOSE: 83 mg/dL (ref 65–99)
POTASSIUM: 3.8 mmol/L (ref 3.5–5.1)
Sodium: 141 mmol/L (ref 135–145)

## 2015-09-28 MED ORDER — CLOTRIMAZOLE 10 MG MT TROC: 10.0000 mg | Freq: Every day | OROMUCOSAL | Status: AC

## 2015-09-28 NOTE — ED Notes (Signed)
Pt repositioned in bed, pillow moved from under left hip to now under right hip.  Pt watching tv per request.  Awaiting test results.

## 2015-09-28 NOTE — ED Notes (Signed)
[facility] has been callled for transport to home address

## 2015-09-28 NOTE — ED Provider Notes (Signed)
CSN: [id number]     Arrival date & time 09/28/15  1540 History   First MD Initiated Contact with Patient 09/28/15 1607     Chief Complaint  Patient presents with  . Aspiration     (Consider location/radiation/quality/duration/timing/severity/associated sxs/prior Treatment) The history is provided by the patient.     Pt with hx dementia, multiple strokes, nonambulatory, nonverbal presents with reported episode of abnormal breathing.  Daughter states pt had an episode of difficulty breathing today.  She states that she has dysphagia from multiple strokes and cannot swallow her pills and does not eat well.  Pt holds food in her mouth and won't swallow, chokes frequently.   Has been coughing, does not spit anything out. She has been sleeping more than usual and not eating well Blood sugars have been in the 160s,180s.  Pt has had UTIs and problems with her foley recently - it was last changed on 09/20/14 and she has had normal output, no concerns.  No known fevers.  No vomiting, diarrhea.  Pt denies pain or SOB by shaking her head no.  Level V caveat for nonverbal patient/dementia.  Past Medical History  Diagnosis Date  . Stroke (HCC)   . Cystitis   . Diabetes mellitus without complication (HCC)   . UTI (urinary tract infection)   . Dementia   . Anemia   . Hypertension    History reviewed. No pertinent past surgical history. No family history on file. Social History  Substance Use Topics  . Smoking status: Never Smoker   . Smokeless tobacco: None  . Alcohol Use: No   OB History    No data available     Review of Systems  Unable to perform ROS: Patient nonverbal      Allergies  Ciprofloxacin; Contrast media; Levaquin; Macrobid; Penicillins; Sulfa antibiotics; Amoxicillin-pot clavulanate; and Latex  Home Medications   Prior to Admission medications   Medication Sig Start Date End Date Taking? Authorizing Provider  acetaminophen (TYLENOL) 650 MG CR tablet Take 650 mg by  mouth every 8 (eight) hours as needed for pain.    Historical Provider, MD  aspirin EC 81 MG tablet Take 81 mg by mouth daily.    Historical Provider, MD  cephALEXin (KEFLEX) 500 MG capsule Take 1 capsule (500 mg total) by mouth 2 (two) times daily. 06/02/15   Kristen N Ward, DO  diphenhydrAMINE (BENADRYL) 12.5 MG/5ML liquid Take 25 mg by mouth 2 (two) times daily as needed for itching.    Historical Provider, MD  ferrous sulfate 325 (65 FE) MG EC tablet Take 325 mg by mouth daily with breakfast.    Historical Provider, MD  gabapentin (NEURONTIN) 100 MG capsule Take 100 mg by mouth 3 (three) times daily.    Historical Provider, MD  insulin glargine (LANTUS) 100 UNIT/ML injection Inject 10-20 Units into the skin at bedtime as needed (high blood sugar). Sliding scale    Historical Provider, MD  losartan (COZAAR) 100 MG tablet Take 100 mg by mouth daily.    Historical Provider, MD  megestrol (MEGACE) 40 MG/ML suspension Take 400 mg by mouth 2 (two) times daily.    Historical Provider, MD  metoprolol tartrate (LOPRESSOR) 25 MG tablet Take 25 mg by mouth 2 (two) times daily.  08/12/15   Historical Provider, MD  nitrofurantoin, macrocrystal-monohydrate, (MACROBID) 100 MG capsule Take 100 mg by mouth 2 (two) times daily.    Historical Provider, MD  ondansetron (ZOFRAN-ODT) 4 MG disintegrating tablet Take 4 mg by mouth  every 8 (eight) hours as needed for nausea or vomiting.    Historical Provider, MD  oxyCODONE-acetaminophen (PERCOCET/ROXICET) 5-325 MG tablet Take 1 tablet by mouth every 8 (eight) hours as needed for severe pain.    Historical Provider, MD  pantoprazole (PROTONIX) 40 MG tablet Take 40 mg by mouth daily.    Historical Provider, MD  tamsulosin (FLOMAX) 0.4 MG CAPS capsule Take 0.4 mg by mouth daily.    Historical Provider, MD  traMADol (ULTRAM) 50 MG tablet Take 1 tablet (50 mg total) by mouth every 6 (six) hours as needed. 06/02/15   Kristen N Ward, DO  triamcinolone cream (KENALOG) 0.5 % Apply  1 application topically daily as needed (rash).    Historical Provider, MD  Vitamin D, Ergocalciferol, (DRISDOL) 50000 UNITS CAPS capsule Take 50,000 Units by mouth every 7 (seven) days.    Historical Provider, MD   BP 146/94 mmHg  Pulse 72  Temp(Src) 99.4 F (37.4 C) (Rectal)  Resp 16  SpO2 100% Physical Exam  Constitutional:  Non-toxic appearance. No distress.  Thin, frail   HENT:  Head: Normocephalic and atraumatic.  White coating of tongue and white patches in pharynx.   Eyes: Conjunctivae are normal.  Neck: Neck supple.  Cardiovascular: Normal rate and regular rhythm.   Pulmonary/Chest: Effort normal. No accessory muscle usage. No tachypnea. No respiratory distress. She has decreased breath sounds in the left lower field. She has no wheezes. She has no rhonchi. She has no rales.  Examined anterior chest  Abdominal: Soft. She exhibits no distension. There is no tenderness. There is no rebound and no guarding.  Neurological: She is alert.  Skin: She is not diaphoretic.  Nursing note and vitals reviewed.   ED Course  Procedures (including critical care time) Labs Review Labs Reviewed  BASIC METABOLIC PANEL - Abnormal; Notable for the following:    Chloride 112 (*)    All other components within normal limits  CBC WITH DIFFERENTIAL/PLATELET - Abnormal; Notable for the following:    RBC 3.76 (*)    Hemoglobin 10.1 (*)    HCT 31.1 (*)    RDW 16.2 (*)    Platelets 468 (*)    All other components within normal limits    Imaging Review Dg Chest Port 1 View  09/28/2015  CLINICAL DATA:  Abnormal breathing. Decreased breath sounds at the left base. Aspiration risk. EXAM: PORTABLE CHEST 1 VIEW COMPARISON:  09/03/2015 FINDINGS: Heart and mediastinal contours are within normal limits. No focal opacities or effusions. No acute bony abnormality. IMPRESSION: No active disease. Electronically Signed   By: Charlett NoseKevin  Dover M.D.   On: 09/28/2015 16:47      EKG  Interpretation   Date/Time:  Monday September 28 2015 16:51:04 EST Ventricular Rate:  75 PR Interval:  69 QRS Duration: 79 QT Interval:  380 QTC Calculation: 424 R Axis:   -2 Text Interpretation:  Sinus rhythm Short PR interval Low voltage,  precordial leads since last tracing no significant change Confirmed by  BELFI  MD, MELANIE ([id number]) on 09/28/2015 5:06:03 PM      MDM   Final diagnoses:  Thrush, oral    Afebrile nontoxic chronically bedbound patient brought in for episode of making strange noises as if she couldn't breathe.  Labs at baseline.  CXR negative.  Pt found to have thrush.  This likely contributes to patient's decreased desire/ability to eat though pt may have baseline dysphagia from multiple strokes.  Pt also seen and examined by Dr Fredderick PhenixBelfi  who agrees with assessment and plan.  Daughter advised to follow closely with PCP.  Discussed result, findings, treatment, and follow up  with patient.  Pt given return precautions.  Pt verbalizes understanding and agrees with plan.       Trixie Dredge, PA-C 09/28/15 2124  Rolan Bucco, MD 09/28/15 [phone]

## 2015-09-28 NOTE — Discharge Instructions (Signed)
Read the information below.  Use the prescribed medication as directed.  Please discuss all new medications with your pharmacist.  You may return to the Emergency Department at any time for worsening condition or any new symptoms that concern you.    If you develop high fevers, difficulty swallowing or breathing, or you are unable to tolerate fluids by mouth, return to the ER immediately for a recheck.     Please discuss your medications with your primary care provider, as well as the diet that would be easier for Ms. Delorise ShinerGrace to tolerate with her swallowing difficulties.     Thrush, Adult  Ginette Pitmanhrush is an infection that can happen on the mouth, throat, tongue, or other areas. It causes white patches to form on the mouth and tongue. HOME CARE  Only take medicine as told by your doctor. You may be given medicine to swallow or to apply right on the area.  Eat plain yogurt that contains live cultures (check the label).  Rinse your mouth many times a day with a warm saltwater rinse. To make the rinse, mix 1 teaspoon (6 g) of salt in 8 ounces (0.2 L) of warm water. To reduce pain:  Drink cold liquids such as water or iced tea.  Eat frozen ice pops or frozen juices.  Eat foods that are easy to swallow, such as gelatin or ice cream.  Drink from a straw if the patches are painful. If you are breastfeeding:  Clean your nipples with an antifungal medicine.  Dry your nipples after breastfeeding.  Use an ointment called lanolin to help relieve nipple soreness. If you wear dentures:  Take out your dentures before going to bed.  Brush them thoroughly.  Soak them in a denture cleaner. GET HELP IF:   Your problems are getting worse.  Your problems are not improving within 7 days of starting treatment.  Your infection is spreading. This may show as white patches on the skin outside of your mouth.  You are nursing and have redness and pain in the nipples. MAKE SURE YOU:  Understand these  instructions.  Will watch your condition.  Will get help right away if you are not doing well or get worse.   This information is not intended to replace advice given to you by your health care provider. Make sure you discuss any questions you have with your health care provider.   Document Released: 11/23/2009 Document Revised: 06/19/2013 Document Reviewed: 04/01/2013 Elsevier Interactive Patient Education [employer]2016 Elsevier Inc.

## 2015-09-28 NOTE — ED Notes (Signed)
Daugheter/caregiver states that pt was "making weird noises" while breathing.  EMS describes as "grunting".  Resp diminished on left. 160/90, resp 18, hr70 Non-ambulatory, pt can communicate in 1 word sentences.  Pt had stroke in feb 2016.

## 2016-10-05 ENCOUNTER — Emergency Department (HOSPITAL_BASED_OUTPATIENT_CLINIC_OR_DEPARTMENT_OTHER)
Admission: EM | Admit: 2016-10-05 | Discharge: 2016-10-06 | Disposition: A | Discharge status: Home / Self Care | Payer: Medicare Other | Attending: Emergency Medicine | Admitting: Emergency Medicine

## 2016-10-05 ENCOUNTER — Encounter (HOSPITAL_BASED_OUTPATIENT_CLINIC_OR_DEPARTMENT_OTHER): Payer: Self-pay | Admitting: *Deleted

## 2016-10-05 DIAGNOSIS — Z79899 Other long term (current) drug therapy: Secondary | ICD-10-CM | POA: Insufficient documentation

## 2016-10-05 DIAGNOSIS — E119 Type 2 diabetes mellitus without complications: Secondary | ICD-10-CM | POA: Insufficient documentation

## 2016-10-05 DIAGNOSIS — T839XXA Unspecified complication of genitourinary prosthetic device, implant and graft, initial encounter: Secondary | ICD-10-CM

## 2016-10-05 DIAGNOSIS — Y732 Prosthetic and other implants, materials and accessory gastroenterology and urology devices associated with adverse incidents: Secondary | ICD-10-CM | POA: Insufficient documentation

## 2016-10-05 DIAGNOSIS — Z7984 Long term (current) use of oral hypoglycemic drugs: Secondary | ICD-10-CM | POA: Insufficient documentation

## 2016-10-05 DIAGNOSIS — R1030 Lower abdominal pain, unspecified: Secondary | ICD-10-CM | POA: Diagnosis present

## 2016-10-05 DIAGNOSIS — I10 Essential (primary) hypertension: Secondary | ICD-10-CM | POA: Insufficient documentation

## 2016-10-05 DIAGNOSIS — Z7982 Long term (current) use of aspirin: Secondary | ICD-10-CM | POA: Diagnosis not present

## 2016-10-05 DIAGNOSIS — T83098A Other mechanical complication of other indwelling urethral catheter, initial encounter: Secondary | ICD-10-CM | POA: Insufficient documentation

## 2016-10-05 LAB — URINALYSIS, MICROSCOPIC (REFLEX)

## 2016-10-05 LAB — URINALYSIS, ROUTINE W REFLEX MICROSCOPIC
BILIRUBIN URINE: NEGATIVE
Glucose, UA: NEGATIVE mg/dL
Ketones, ur: NEGATIVE mg/dL
NITRITE: NEGATIVE
PH: 6 (ref 5.0–8.0)
Protein, ur: 30 mg/dL — AB
SPECIFIC GRAVITY, URINE: 1.006 (ref 1.005–1.030)

## 2016-10-05 NOTE — ED Notes (Signed)
During the bladder Scan , Urine flowed out around catheter. Unmeasured.

## 2016-10-05 NOTE — ED Notes (Signed)
[facility] pt brought to ED for c/o lower abd pain, pt has foley catheter in place, family tells EMS that the foley is not draining

## 2016-10-05 NOTE — ED Provider Notes (Signed)
[facility] Provider Note: Jamie Dell, MD, FACEP  CSN: [id number] MRN: [id number] ARRIVAL: 10/05/16 at 2230 ROOM: MH09/MH09  By signing my name below, I, Jamie Valentine, attest that this documentation has been prepared under the direction and in the presence of Paula Libra, MD. Electronically Signed: Modena Valentine, Scribe. 10/05/2016. 11:06 PM.  CHIEF COMPLAINT  Abdominal Pain   HISTORY OF PRESENT ILLNESS   LEVEL 5 CAVEAT DUE TO DEMENTIA  Jamie Valentine is a 83 y.o. female with a long-standing history of an indwelling Foley catheter. She presents to the Emergency Department complaining of several hours of lower abdominal pain. Per staff she had urine flowing around her Foley catheter during her bladder scan on arrival. She was found to have about 200 milliliters of urine in her bladder. She states the pain earlier was the worst she has ever had. It was worse with movement or palpation. She was afebrile on arrival.   Past Medical History:  Diagnosis Date  . Anemia   . Cystitis   . Dementia   . Diabetes mellitus without complication (HCC)   . Hypertension   . Stroke (HCC)   . UTI (urinary tract infection)     Past Surgical History:  Procedure Laterality Date  . ABDOMINAL HYSTERECTOMY    . BLADDER SURGERY    . EYE SURGERY    . JOINT REPLACEMENT      History reviewed. No pertinent family history.  Social History  Substance Use Topics  . Smoking status: Never Smoker  . Smokeless tobacco: Not on file  . Alcohol use No    Prior to Admission medications   Medication Sig Start Date End Date Taking? Authorizing Provider  acetaminophen (TYLENOL) 650 MG CR tablet Take 650 mg by mouth every 8 (eight) hours as needed for pain.    Historical Provider, MD  aspirin EC 81 MG tablet Take 81 mg by mouth daily.    Historical Provider, MD  cephALEXin (KEFLEX) 500 MG capsule Take 1 capsule (500 mg total) by mouth 2 (two) times daily. 06/02/15   Kristen N Ward, DO    clotrimazole (MYCELEX) 10 MG troche Take 1 tablet (10 mg total) by mouth 5 (five) times daily. 09/28/15   Trixie Dredge, PA-C  diphenhydrAMINE (BENADRYL) 12.5 MG/5ML liquid Take 25 mg by mouth 2 (two) times daily as needed for itching.    Historical Provider, MD  ferrous sulfate 325 (65 FE) MG EC tablet Take 325 mg by mouth daily with breakfast.    Historical Provider, MD  gabapentin (NEURONTIN) 100 MG capsule Take 100 mg by mouth 3 (three) times daily.    Historical Provider, MD  insulin glargine (LANTUS) 100 UNIT/ML injection Inject 10-20 Units into the skin at bedtime as needed (high blood sugar). Sliding scale    Historical Provider, MD  losartan (COZAAR) 100 MG tablet Take 100 mg by mouth daily.    Historical Provider, MD  megestrol (MEGACE) 40 MG/ML suspension Take 400 mg by mouth 2 (two) times daily.    Historical Provider, MD  metoprolol tartrate (LOPRESSOR) 25 MG tablet Take 25 mg by mouth 2 (two) times daily.  08/12/15   Historical Provider, MD  nitrofurantoin, macrocrystal-monohydrate, (MACROBID) 100 MG capsule Take 100 mg by mouth 2 (two) times daily.    Historical Provider, MD  ondansetron (ZOFRAN-ODT) 4 MG disintegrating tablet Take 4 mg by mouth every 8 (eight) hours as needed for nausea or vomiting.    Historical Provider, MD  oxyCODONE-acetaminophen (  PERCOCET/ROXICET) 5-325 MG tablet Take 1 tablet by mouth every 8 (eight) hours as needed for severe pain.    Historical Provider, MD  pantoprazole (PROTONIX) 40 MG tablet Take 40 mg by mouth daily.    Historical Provider, MD  tamsulosin (FLOMAX) 0.4 MG CAPS capsule Take 0.4 mg by mouth daily.    Historical Provider, MD  traMADol (ULTRAM) 50 MG tablet Take 1 tablet (50 mg total) by mouth every 6 (six) hours as needed. 06/02/15   Kristen N Ward, DO  triamcinolone cream (KENALOG) 0.5 % Apply 1 application topically daily as needed (rash).    Historical Provider, MD  Vitamin D, Ergocalciferol, (DRISDOL) 50000 UNITS CAPS capsule Take 50,000 Units  by mouth every 7 (seven) days.    Historical Provider, MD    Allergies Ciprofloxacin; Contrast media [iodinated diagnostic agents]; Levaquin [levofloxacin]; Macrobid [nitrofurantoin]; Penicillins; Sulfa antibiotics; Amoxicillin-pot clavulanate; and Latex   REVIEW OF SYSTEMS  Negative except as noted here or in the History of Present Illness.   PHYSICAL EXAMINATION  Initial Vital Signs Blood pressure (!) 145/105, pulse 60, temperature 98.4 F (36.9 C), temperature source Oral, height 5' (1.524 m), weight 160 lb (72.6 kg), SpO2 100 %.  Examination General: Well-developed, well-nourished female in no acute distress; appearance consistent with age of record HENT: normocephalic; atraumatic Eyes: pupils equal, round and reactive to light; extraocular muscles intact, arcus senilis bilaterally Neck: supple Heart: regular rate and rhythm Lungs: clear to auscultation bilaterally Abdomen: soft; nondistended; suprapubic tenderness; no masses or hepatosplenomegaly; bowel sounds present Extremities: No deformity; full range of motion; pulses normal; trace edema of lower legs Neurologic: Awake, alert; motor function intact in all extremities and symmetric; no facial droop Skin: Warm and dry Psychiatric: Normal mood and affect GU: Foley catheter   RESULTS  Summary of this visit's results, reviewed by myself:   EKG Interpretation  Date/Time:    Ventricular Rate:    PR Interval:    QRS Duration:   QT Interval:    QTC Calculation:   R Axis:     Text Interpretation:        Laboratory Studies: Results for orders placed or performed during the hospital encounter of 10/05/16 (from the past 24 hour(s))  Urinalysis, Routine w reflex microscopic     Status: Abnormal   Collection Time: 10/05/16 11:31 PM  Result Value Ref Range   Color, Urine YELLOW YELLOW   APPearance CLEAR CLEAR   Specific Gravity, Urine 1.006 1.005 - 1.030   pH 6.0 5.0 - 8.0   Glucose, UA NEGATIVE NEGATIVE mg/dL   Hgb  urine dipstick SMALL (A) NEGATIVE   Bilirubin Urine NEGATIVE NEGATIVE   Ketones, ur NEGATIVE NEGATIVE mg/dL   Protein, ur 30 (A) NEGATIVE mg/dL   Nitrite NEGATIVE NEGATIVE   Leukocytes, UA MODERATE (A) NEGATIVE  Urinalysis, Microscopic (reflex)     Status: Abnormal   Collection Time: 10/05/16 11:31 PM  Result Value Ref Range   RBC / HPF 0-5 0 - 5 RBC/hpf   WBC, UA 6-30 0 - 5 WBC/hpf   Bacteria, UA MANY (A) NONE SEEN   Squamous Epithelial / LPF 0-5 (A) NONE SEEN   WBC Clumps PRESENT    Imaging Studies: No results found.  ED COURSE  Nursing notes and initial vitals signs, including pulse oximetry, reviewed.  Vitals:   10/05/16 2231  BP: (!) 145/105  Pulse: 60  Temp: 98.4 F (36.9 C)  TempSrc: Oral  SpO2: 100%  Weight: 160 lb (72.6 kg)  Height:  5' (1.524 m)   12:12 AM Patient asymptomatic after Foley catheter change. Urine sent for culture. Will not prescribe an antibiotic at this time due to multiple antibiotic allergies of unspecified severity.  PROCEDURES    ED DIAGNOSES     ICD-9-CM ICD-10-CM   1. Complication of Foley catheter, initial encounter (HCC) 996.76 T83.9XXA     I personally performed the services described in this documentation, which was scribed in my presence. The recorded information has been reviewed and is accurate.     Paula LibraJohn Myrka Sylva, MD 10/06/16 [phone]

## 2016-10-05 NOTE — ED Triage Notes (Signed)
Bladder scan performed, family at bedside to provide hx and background of pt

## 2016-10-06 NOTE — ED Notes (Signed)
Contacted [facility] for transportation back to patient's home

## 2016-10-06 NOTE — ED Notes (Signed)
[facility] here for transport at this time.  

## 2016-10-07 LAB — URINE CULTURE

## 2017-09-08 ENCOUNTER — Encounter (HOSPITAL_COMMUNITY): Payer: Self-pay | Admitting: Emergency Medicine

## 2017-09-08 ENCOUNTER — Emergency Department (HOSPITAL_COMMUNITY)
Admission: EM | Admit: 2017-09-08 | Discharge: 2017-09-08 | Disposition: A | Discharge status: Home / Self Care | Payer: Medicare Other | Attending: Emergency Medicine | Admitting: Emergency Medicine

## 2017-09-08 ENCOUNTER — Other Ambulatory Visit: Payer: Self-pay

## 2017-09-08 DIAGNOSIS — I10 Essential (primary) hypertension: Secondary | ICD-10-CM | POA: Diagnosis not present

## 2017-09-08 DIAGNOSIS — Z91040 Latex allergy status: Secondary | ICD-10-CM | POA: Insufficient documentation

## 2017-09-08 DIAGNOSIS — F0390 Unspecified dementia without behavioral disturbance: Secondary | ICD-10-CM | POA: Insufficient documentation

## 2017-09-08 DIAGNOSIS — Y829 Unspecified medical devices associated with adverse incidents: Secondary | ICD-10-CM | POA: Insufficient documentation

## 2017-09-08 DIAGNOSIS — Z7982 Long term (current) use of aspirin: Secondary | ICD-10-CM | POA: Diagnosis not present

## 2017-09-08 DIAGNOSIS — Z8673 Personal history of transient ischemic attack (TIA), and cerebral infarction without residual deficits: Secondary | ICD-10-CM | POA: Insufficient documentation

## 2017-09-08 DIAGNOSIS — T83091A Other mechanical complication of indwelling urethral catheter, initial encounter: Secondary | ICD-10-CM | POA: Insufficient documentation

## 2017-09-08 DIAGNOSIS — Z79899 Other long term (current) drug therapy: Secondary | ICD-10-CM | POA: Diagnosis not present

## 2017-09-08 DIAGNOSIS — T839XXA Unspecified complication of genitourinary prosthetic device, implant and graft, initial encounter: Secondary | ICD-10-CM

## 2017-09-08 LAB — URINALYSIS, ROUTINE W REFLEX MICROSCOPIC
Bilirubin Urine: NEGATIVE
Glucose, UA: 50 mg/dL — AB
Ketones, ur: NEGATIVE mg/dL
Nitrite: NEGATIVE
Protein, ur: 100 mg/dL — AB
Specific Gravity, Urine: 1.006 (ref 1.005–1.030)
pH: 7 (ref 5.0–8.0)

## 2017-09-08 NOTE — ED Notes (Signed)
Belfi MD spoke with family regarding pending discharge. [facility] called to transfer pt home.

## 2017-09-08 NOTE — ED Provider Notes (Signed)
[facility]-EMERGENCY DEPT Provider Note   CSN: [id number] Arrival date & time: 09/08/17  1603     History   Chief Complaint Chief Complaint  Patient presents with  . Foley Problem    HPI Jamie BaconHelen L Valentine is a 83 y.o. female.  Patient is a 83 year old female who presents with lower abdominal pain.  History is limited due to her dementia.  She has a long-standing Foley catheter in place.  Per EMS, her catheter was violently removed about 2 weeks ago.  Since that time she is complained of lower abdominal pain.  It does appear that the catheter is been draining normally.  She has had no reported vomiting.  No fevers.      Past Medical History:  Diagnosis Date  . Anemia   . Cystitis   . Dementia   . Diabetes mellitus without complication (HCC)   . Hypertension   . Stroke (HCC)   . UTI (urinary tract infection)     There are no active problems to display for this patient.   Past Surgical History:  Procedure Laterality Date  . ABDOMINAL HYSTERECTOMY    . BLADDER SURGERY    . EYE SURGERY    . JOINT REPLACEMENT      OB History    No data available       Home Medications    Prior to Admission medications   Medication Sig Start Date End Date Taking? Authorizing Provider  nystatin (MYCOSTATIN/NYSTOP) powder APPLY EXTERNALLY TO THE AFFECTED AREA OF SKIN TWICE DAILY FOR 2 WEEKS AS NEEDED 12/19/16  Yes [provider]  potassium chloride (K-DUR) 10 MEQ tablet Take 20 mEq by mouth 2 (two) times daily. 12/20/16  Yes [provider]  acetaminophen (TYLENOL) 650 MG CR tablet Take 650 mg by mouth every 8 (eight) hours as needed for pain.    [provider]  aspirin EC 81 MG tablet Take 81 mg by mouth daily.    [provider]  cephALEXin (KEFLEX) 500 MG capsule Take 1 capsule (500 mg total) by mouth 2 (two) times daily. 06/02/15   Ward, Layla MawKristen N, DO  clotrimazole (MYCELEX) 10 MG troche Take 1 tablet (10 mg total) by mouth 5  (five) times daily. 09/28/15   Trixie DredgeWest, Emily, PA-C  diphenhydrAMINE (BENADRYL) 12.5 MG/5ML liquid Take 25 mg by mouth 2 (two) times daily as needed for itching.    [provider]  ferrous sulfate 325 (65 FE) MG EC tablet Take 325 mg by mouth daily with breakfast.    [provider]  gabapentin (NEURONTIN) 100 MG capsule Take 100 mg by mouth 3 (three) times daily.    [provider]  insulin glargine (LANTUS) 100 UNIT/ML injection Inject 10-20 Units into the skin at bedtime as needed (high blood sugar). Sliding scale    [provider]  losartan (COZAAR) 100 MG tablet Take 100 mg by mouth daily.    [provider]  megestrol (MEGACE) 40 MG/ML suspension Take 400 mg by mouth 2 (two) times daily.    [provider]  metoprolol tartrate (LOPRESSOR) 25 MG tablet Take 25 mg by mouth 2 (two) times daily.  08/12/15   [provider]  nitrofurantoin, macrocrystal-monohydrate, (MACROBID) 100 MG capsule Take 100 mg by mouth 2 (two) times daily.    [provider]  ondansetron (ZOFRAN-ODT) 4 MG disintegrating tablet Take 4 mg by mouth every 8 (eight) hours as needed for nausea or vomiting.    [provider]  oxyCODONE-acetaminophen (PERCOCET/ROXICET) 5-325 MG tablet Take 1 tablet by mouth every 8 (eight) hours as needed for severe pain.    [provider]  pantoprazole (PROTONIX) 40 MG tablet Take 40 mg by mouth daily.    [provider]  pravastatin (PRAVACHOL) 40 MG tablet Take 40 mg by mouth daily. 08/09/17   [provider]  tamsulosin (FLOMAX) 0.4 MG CAPS capsule Take 0.4 mg by mouth daily.    [provider]  traMADol (ULTRAM) 50 MG tablet Take 1 tablet (50 mg total) by mouth every 6 (six) hours as needed. 06/02/15   Ward, Layla Maw, DO  triamcinolone cream (KENALOG) 0.5 % Apply 1 application topically daily as needed (rash).    [provider]  Vitamin D, Ergocalciferol, (DRISDOL)  50000 UNITS CAPS capsule Take 50,000 Units by mouth every 7 (seven) days.    [provider]    Family History No family history on file.  Social History Social History   Tobacco Use  . Smoking status: Never Smoker  Substance Use Topics  . Alcohol use: No  . Drug use: No     Allergies   Sulfamethoxazole-trimethoprim; Ciprofloxacin; Contrast media [iodinated diagnostic agents]; Doxazosin; Levaquin [levofloxacin]; Macrobid [nitrofurantoin]; Penicillins; Sulfa antibiotics; Amoxicillin-pot clavulanate; and Latex   Review of Systems Review of Systems  Unable to perform ROS: Dementia     Physical Exam Updated Vital Signs BP (!) 206/89   Pulse 71   Temp 98.3 F (36.8 C)   Resp 18   SpO2 100%   Physical Exam  Constitutional: She is oriented to person, place, and time. She appears well-developed and well-nourished.  HENT:  Head: Normocephalic and atraumatic.  Eyes: Pupils are equal, round, and reactive to light.  Neck: Normal range of motion. Neck supple.  Cardiovascular: Normal rate, regular rhythm and normal heart sounds.  Pulmonary/Chest: Effort normal and breath sounds normal. No respiratory distress. She has no wheezes. She has no rales. She exhibits no tenderness.  Abdominal: Soft. Bowel sounds are normal. There is no tenderness. There is no rebound and no guarding.  Genitourinary:  Genitourinary Comments: Patient has no pain on palpation of the abdomen or suprapubic region.  The Foley catheter is in place with no bleeding.  No visible trauma.  No tears.  Appears to be draining normally.  Bladder scan revealed 58 cc of urine  Musculoskeletal: Normal range of motion. She exhibits no edema.  Lymphadenopathy:    She has no cervical adenopathy.  Neurological: She is alert and oriented to person, place, and time.  Skin: Skin is warm and dry. No rash noted.  Psychiatric: She has a normal mood and affect.     ED Treatments / Results  Labs (all labs ordered are  listed, but only abnormal results are displayed) Labs Reviewed  URINALYSIS, ROUTINE W REFLEX MICROSCOPIC - Abnormal; Notable for the following components:      Result Value   Glucose, UA 50 (*)    Hgb urine dipstick SMALL (*)    Protein, ur 100 (*)    Leukocytes, UA LARGE (*)    Bacteria, UA MANY (*)    Squamous Epithelial / LPF 0-5 (*)    All other components within normal limits  URINE CULTURE    EKG  EKG Interpretation None       Radiology No results found.  Procedures Procedures (including critical care time)  Medications Ordered in ED Medications - No data to display   Initial Impression /  Assessment and Plan / ED Course  I have reviewed the triage vital signs and the nursing notes.  Pertinent labs & imaging results that were available during my care of the patient were reviewed by me and considered in my medical decision making (see chart for details).     Patient is a 83 year old female who presents with pain around her catheter.  Her daughter did arrive and states that home health nurse pulled out the Foley catheter about 2 weeks ago without deflating the balloon.  She has been complaining of pain around the area since that time.  I do not see any evidence of external visible trauma.  There is no bleeding.  Her Foley catheter was changed out yesterday by the home health nurse and appears to be draining well.  There is no retention noted on bladder scan.  She has no fever.  No vomiting.  No change in mental status.  At this point given her multiple drug allergies and chronic indwelling Foley, I do not feel it is appropriate to start her antibiotics until he culture comes back.  She does not have any other symptoms of systemic illness.  She has no abdominal pain on exam.  She was discharged back to home in good condition.  I advised the daughter that if her symptoms continue, she should likely follow up with her urologist who [facility] [place] [place] [facility].  She was advised  to bring the patient back to the emergency department if she has any worsening symptoms such as fever, confusion, worsening abdominal pain or vomiting.  Final Clinical Impressions(s) / ED Diagnoses   Final diagnoses:  Problem with Foley catheter, initial encounter [facility](HCC)    ED Discharge Orders    None       Rolan BuccoBelfi, Oveda Dadamo, MD 09/08/17 1729

## 2017-09-08 NOTE — ED Triage Notes (Signed)
Per EMS pt family concerned related to pt complaint of pain since "catheter violently extracted two weeks ago." Pt since has had foley placed and is draining.

## 2017-09-10 LAB — URINE CULTURE

## 2018-08-17 ENCOUNTER — Other Ambulatory Visit: Payer: Self-pay

## 2018-08-17 ENCOUNTER — Encounter (HOSPITAL_COMMUNITY): Payer: Self-pay | Admitting: *Deleted

## 2018-08-17 ENCOUNTER — Emergency Department (HOSPITAL_COMMUNITY)
Admission: EM | Admit: 2018-08-17 | Discharge: 2018-08-17 | Disposition: A | Discharge status: Home / Self Care | Payer: Medicare Other | Attending: Emergency Medicine | Admitting: Emergency Medicine

## 2018-08-17 DIAGNOSIS — K9423 Gastrostomy malfunction: Secondary | ICD-10-CM

## 2018-08-17 DIAGNOSIS — T85518A Breakdown (mechanical) of other gastrointestinal prosthetic devices, implants and grafts, initial encounter: Secondary | ICD-10-CM | POA: Diagnosis not present

## 2018-08-17 DIAGNOSIS — Y69 Unspecified misadventure during surgical and medical care: Secondary | ICD-10-CM | POA: Insufficient documentation

## 2018-08-17 NOTE — ED Provider Notes (Signed)
[facility]   CSN: [id number] Arrival date & time: 08/17/18  1904     History   Chief Complaint Chief Complaint  Patient presents with  . Peg tube problem    HPI Jamie BaconHelen L Valentine is a 84 y.o. female.  HPI Patient brought in by family member for clogged G-tube.  Patient has history of stroke.  She is nonverbal.  Unable to contribute to history. Past Medical History:  Diagnosis Date  . Anemia   . Cystitis   . Dementia (HCC)   . Diabetes mellitus without complication (HCC)   . Hypertension   . Stroke (HCC)   . UTI (urinary tract infection)     There are no active problems to display for this patient.   Past Surgical History:  Procedure Laterality Date  . ABDOMINAL HYSTERECTOMY    . BLADDER SURGERY    . EYE SURGERY    . JOINT REPLACEMENT       OB History   None      Home Medications    Prior to Admission medications   Medication Sig Start Date End Date Taking? Authorizing Provider  acetaminophen (TYLENOL) 650 MG CR tablet Take 650 mg by mouth every 8 (eight) hours as needed for pain.    [provider]  aspirin EC 81 MG tablet Take 81 mg by mouth daily.    [provider]  cephALEXin (KEFLEX) 500 MG capsule Take 1 capsule (500 mg total) by mouth 2 (two) times daily. Patient not taking: Reported on 09/08/2017 06/02/15   Ward, Layla MawKristen N, DO  clotrimazole (MYCELEX) 10 MG troche Take 1 tablet (10 mg total) by mouth 5 (five) times daily. 09/28/15   Trixie DredgeWest, Emily, PA-C  diphenhydrAMINE (BENADRYL) 12.5 MG/5ML liquid Take 25 mg by mouth 2 (two) times daily as needed for itching.    [provider]  ferrous sulfate 325 (65 FE) MG EC tablet Take 325 mg by mouth daily with breakfast.    [provider]  gabapentin (NEURONTIN) 100 MG capsule Take 100 mg by mouth 3 (three) times daily.    [provider]  insulin glargine (LANTUS) 100 UNIT/ML injection Inject 10-20 Units into the skin at  bedtime as needed (high blood sugar). Sliding scale    [provider]  losartan (COZAAR) 100 MG tablet Take 100 mg by mouth daily.    [provider]  megestrol (MEGACE) 40 MG/ML suspension Take 400 mg by mouth 2 (two) times daily.    [provider]  metoprolol tartrate (LOPRESSOR) 25 MG tablet Take 25 mg by mouth 2 (two) times daily.  08/12/15   [provider]  nitrofurantoin, macrocrystal-monohydrate, (MACROBID) 100 MG capsule Take 100 mg by mouth 2 (two) times daily.    [provider]  nystatin (MYCOSTATIN/NYSTOP) powder APPLY EXTERNALLY TO THE AFFECTED AREA OF SKIN TWICE DAILY FOR 2 WEEKS AS NEEDED 12/19/16   [provider]  ondansetron (ZOFRAN-ODT) 4 MG disintegrating tablet Take 4 mg by mouth every 8 (eight) hours as needed for nausea or vomiting.    [provider]  oxyCODONE-acetaminophen (PERCOCET/ROXICET) 5-325 MG tablet Take 1 tablet by mouth every 8 (eight) hours as needed for severe pain.    [provider]  pantoprazole (PROTONIX) 40 MG tablet Take 40 mg by mouth daily.    [provider]  potassium chloride (K-DUR) 10 MEQ tablet Take 20 mEq by mouth 2 (two) times daily. 12/20/16   [provider]  pravastatin (PRAVACHOL) 40 MG tablet Take 40 mg by mouth daily. 08/09/17   [provider]  tamsulosin (FLOMAX) 0.4 MG CAPS capsule Take 0.4 mg by mouth daily.    [provider]  traMADol (ULTRAM) 50 MG tablet Take 1 tablet (50 mg total) by mouth every 6 (six) hours as needed. 06/02/15   Ward, Layla Maw, DO  triamcinolone cream (KENALOG) 0.5 % Apply 1 application topically daily as needed (rash).    [provider]  Vitamin D, Ergocalciferol, (DRISDOL) 50000 UNITS CAPS capsule Take 50,000 Units by mouth every 7 (seven) days.    [provider]    Family History No family history on file.  Social History Social History   Tobacco Use  . Smoking status: Never  Smoker  Substance Use Topics  . Alcohol use: No  . Drug use: No     Allergies   Sulfamethoxazole-trimethoprim; Ciprofloxacin; Contrast media [iodinated diagnostic agents]; Doxazosin; Levaquin [levofloxacin]; Macrobid [nitrofurantoin]; Penicillins; Sulfa antibiotics; Amoxicillin-pot clavulanate; and Latex   Review of Systems Review of Systems  Unable to perform ROS: Patient nonverbal     Physical Exam Updated Vital Signs BP (!) 170/72 (BP Location: Right Arm)   Pulse 64   Temp (!) 97.5 F (36.4 C) (Oral)   Resp 14   Ht 5\' 1"  (1.549 m)   Wt 77.1 kg   SpO2 99%   BMI 32.12 kg/m   Physical Exam  Constitutional: She appears well-developed and well-nourished.  Chronically ill-appearing  HENT:  Head: Normocephalic and atraumatic.  Eyes: Pupils are equal, round, and reactive to light. EOM are normal.  Neck: Normal range of motion. Neck supple.  Cardiovascular: Normal rate and regular rhythm.  Pulmonary/Chest: Effort normal and breath sounds normal.  Abdominal: Soft. Bowel sounds are normal. There is no tenderness. There is no rebound and no guarding.  PEG tube in place.  Musculoskeletal: Normal range of motion. She exhibits no edema or tenderness.  Neurological: She is alert.  Left-sided hemiparesis.  Nonverbal.  Skin: Skin is warm and dry. Capillary refill takes less than 2 seconds. No rash noted. No erythema.  Psychiatric: She has a normal mood and affect. Her behavior is normal.  Nursing note and vitals reviewed.    ED Treatments / Results  Labs (all labs ordered are listed, but only abnormal results are displayed) Labs Reviewed - No data to display  EKG None  Radiology No results found.  Procedures Procedures (including critical care time)  Medications Ordered in ED Medications - No data to display   Initial Impression / Assessment and Plan / ED Course  I have reviewed the triage vital signs and the nursing notes.  Pertinent labs & imaging results  that were available during my care of the patient were reviewed by me and considered in my medical decision making (see chart for details).      There is a hole in the distal tip of the PEG tube.  Family attempted to repair with tape.  Leaking around tape.  Repaired in the emergency department and flushed.  PEG tube is now flushing well.  Advised to flush with warm water before and after feedings.  Return precautions given  Final Clinical Impressions(s) / ED Diagnoses   Final diagnoses:  PEG tube malfunction [facility])    ED Discharge Orders    None       Loren Racer, MD 08/17/18 2301

## 2018-08-17 NOTE — ED Notes (Signed)
Pt daughter called 4 times. Home and cell called. Nurse did not receive an answer. Pt discharged home via [facility].

## 2018-08-17 NOTE — ED Triage Notes (Signed)
Per [facility], family was attempting to flush peg and tube broke.

## 2018-08-17 NOTE — ED Notes (Signed)
Nurse made 2 attempts to flush peg tube per MD order. Nurse unable to flush tube. Tube appears to be clogged. Will continue to monitor.

## 2018-08-17 NOTE — ED Notes (Signed)
Jaymes GraffBetty Hayes, daughter, signed d/c instructions & took instructions with her d/t pt having to be transported by [facility] back, to residence.

## 2018-08-17 NOTE — ED Notes (Signed)
Bed: [id number] Expected date:  Expected time:  Means of arrival:  Comments: EMS 84 yo female from home-clogged feeding tube-tube broken trying to get tube unclogged

## 2018-11-26 ENCOUNTER — Encounter (HOSPITAL_COMMUNITY): Payer: Self-pay | Admitting: Emergency Medicine

## 2018-11-26 ENCOUNTER — Emergency Department (HOSPITAL_COMMUNITY)
Admission: EM | Admit: 2018-11-26 | Discharge: 2018-11-26 | Disposition: A | Discharge status: Home / Self Care | Payer: Medicare Other | Attending: Emergency Medicine | Admitting: Emergency Medicine

## 2018-11-26 ENCOUNTER — Other Ambulatory Visit: Payer: Self-pay

## 2018-11-26 DIAGNOSIS — Z7982 Long term (current) use of aspirin: Secondary | ICD-10-CM | POA: Diagnosis not present

## 2018-11-26 DIAGNOSIS — N3090 Cystitis, unspecified without hematuria: Secondary | ICD-10-CM

## 2018-11-26 DIAGNOSIS — R319 Hematuria, unspecified: Secondary | ICD-10-CM | POA: Diagnosis present

## 2018-11-26 DIAGNOSIS — Z91040 Latex allergy status: Secondary | ICD-10-CM | POA: Diagnosis not present

## 2018-11-26 DIAGNOSIS — F0390 Unspecified dementia without behavioral disturbance: Secondary | ICD-10-CM | POA: Diagnosis not present

## 2018-11-26 DIAGNOSIS — I10 Essential (primary) hypertension: Secondary | ICD-10-CM | POA: Diagnosis not present

## 2018-11-26 DIAGNOSIS — Z794 Long term (current) use of insulin: Secondary | ICD-10-CM | POA: Insufficient documentation

## 2018-11-26 DIAGNOSIS — Z79899 Other long term (current) drug therapy: Secondary | ICD-10-CM | POA: Diagnosis not present

## 2018-11-26 DIAGNOSIS — Z8673 Personal history of transient ischemic attack (TIA), and cerebral infarction without residual deficits: Secondary | ICD-10-CM | POA: Diagnosis not present

## 2018-11-26 DIAGNOSIS — N3091 Cystitis, unspecified with hematuria: Secondary | ICD-10-CM | POA: Insufficient documentation

## 2018-11-26 DIAGNOSIS — E119 Type 2 diabetes mellitus without complications: Secondary | ICD-10-CM | POA: Diagnosis not present

## 2018-11-26 LAB — URINALYSIS, ROUTINE W REFLEX MICROSCOPIC
BILIRUBIN URINE: NEGATIVE
Glucose, UA: NEGATIVE mg/dL
KETONES UR: NEGATIVE mg/dL
Nitrite: NEGATIVE
PROTEIN: 30 mg/dL — AB
Specific Gravity, Urine: 1.008 (ref 1.005–1.030)
pH: 9 — ABNORMAL HIGH (ref 5.0–8.0)

## 2018-11-26 LAB — CBC WITH DIFFERENTIAL/PLATELET
Abs Immature Granulocytes: 0.02 10*3/uL (ref 0.00–0.07)
BASOS ABS: 0 10*3/uL (ref 0.0–0.1)
BASOS PCT: 1 %
EOS ABS: 0.5 10*3/uL (ref 0.0–0.5)
EOS PCT: 6 %
HEMATOCRIT: 36.8 % (ref 36.0–46.0)
Hemoglobin: 11 g/dL — ABNORMAL LOW (ref 12.0–15.0)
IMMATURE GRANULOCYTES: 0 %
Lymphocytes Relative: 34 %
Lymphs Abs: 2.6 10*3/uL (ref 0.7–4.0)
MCH: 26.3 pg (ref 26.0–34.0)
MCHC: 29.9 g/dL — AB (ref 30.0–36.0)
MCV: 87.8 fL (ref 80.0–100.0)
MONOS PCT: 6 %
Monocytes Absolute: 0.5 10*3/uL (ref 0.1–1.0)
NEUTROS PCT: 53 %
NRBC: 0 % (ref 0.0–0.2)
Neutro Abs: 4.2 10*3/uL (ref 1.7–7.7)
PLATELETS: 266 10*3/uL (ref 150–400)
RBC: 4.19 MIL/uL (ref 3.87–5.11)
RDW: 14.2 % (ref 11.5–15.5)
WBC: 7.7 10*3/uL (ref 4.0–10.5)

## 2018-11-26 LAB — BASIC METABOLIC PANEL
ANION GAP: 7 (ref 5–15)
BUN: 43 mg/dL — ABNORMAL HIGH (ref 8–23)
CALCIUM: 9.9 mg/dL (ref 8.9–10.3)
CO2: 24 mmol/L (ref 22–32)
CREATININE: 0.94 mg/dL (ref 0.44–1.00)
Chloride: 106 mmol/L (ref 98–111)
GFR calc Af Amer: >60 mL/min (ref >60)
GFR, EST NON AFRICAN AMERICAN: 54 mL/min — AB (ref >60)
GLUCOSE: 126 mg/dL — AB (ref 70–99)
Potassium: 4.6 mmol/L (ref 3.5–5.1)
Sodium: 137 mmol/L (ref 135–145)

## 2018-11-26 MED ORDER — CEPHALEXIN 250 MG/5ML PO SUSR: 500.0000 mg | Freq: Three times a day (TID) | ORAL | 0 refills | Status: AC

## 2018-11-26 MED ORDER — STERILE WATER FOR INJECTION IJ SOLN
INTRAMUSCULAR | Status: AC
  Administered 2018-11-26: 10 mL
  Filled 2018-11-26: qty 10

## 2018-11-26 MED ORDER — LIDOCAINE HCL (PF) 1 % IJ SOLN
INTRAMUSCULAR | Status: AC
  Administered 2018-11-26: 5 mL
  Filled 2018-11-26: qty 5

## 2018-11-26 MED ORDER — CEFTRIAXONE SODIUM 1 G IJ SOLR
1.0000 g | Freq: Once | INTRAMUSCULAR | Status: AC
  Administered 2018-11-26: 1 g via INTRAMUSCULAR
  Filled 2018-11-26: qty 10

## 2018-11-26 NOTE — ED Triage Notes (Signed)
Pt here via [facility] from home where she lives with her daughter, was at [facility] 2 weeks ago and left with a catheter, daughter noticed 2 hours ago that urine in bag was bloody with lots of clots. Pt is nonverbal but nods appropriately for orientation questions.

## 2018-11-26 NOTE — Discharge Instructions (Addendum)
Drink plenty of fluids. Her family doctor check every the end of the week. Urine culture most likely will be finished by then.

## 2018-11-26 NOTE — ED Notes (Signed)
Called [facility] 

## 2018-11-26 NOTE — ED Notes (Signed)
This RN attempted to draw blood and was unsuccessful, will call phlebotomy to attempt.

## 2018-11-26 NOTE — ED Notes (Addendum)
Discharge instructions discussed with Pt, daughter and [facility]. Pt nodded yes to understanding. Pt stable and leaving via [facility].

## 2018-11-26 NOTE — ED Provider Notes (Signed)
[facility] EMERGENCY DEPARTMENT Provider Note   CSN: [id number] Arrival date & time: 11/26/18  1602    History   Chief Complaint Chief Complaint  Patient presents with  . Hematuria    HPI Jamie Valentine is a 85 y.o. female.     Patient has an indwelling Foley catheter. In regard to have blood catheter.  The history is provided by the patient. No language interpreter was used.  Hematuria  This is a new problem. The current episode started yesterday. The problem occurs constantly. The problem has not changed since onset.Pertinent negatives include no chest pain, no abdominal pain and no headaches. Nothing aggravates the symptoms. Nothing relieves the symptoms.    Past Medical History:  Diagnosis Date  . Anemia   . Cystitis   . Dementia (HCC)   . Diabetes mellitus without complication (HCC)   . Hypertension   . Stroke (HCC)   . UTI (urinary tract infection)     There are no active problems to display for this patient.   Past Surgical History:  Procedure Laterality Date  . ABDOMINAL HYSTERECTOMY    . BLADDER SURGERY    . EYE SURGERY    . JOINT REPLACEMENT       OB History   No obstetric history on file.      Home Medications    Prior to Admission medications   Medication Sig Start Date End Date Taking? Authorizing Provider  acetaminophen (TYLENOL) 650 MG CR tablet Take 650 mg by mouth every 8 (eight) hours as needed for pain.    [provider]  aspirin EC 81 MG tablet Take 81 mg by mouth daily.    [provider]  cephALEXin (KEFLEX) 250 MG/5ML suspension Place 10 mLs (500 mg total) into feeding tube 3 (three) times daily. 11/26/18   Bethann Berkshire, MD  clotrimazole (MYCELEX) 10 MG troche Take 1 tablet (10 mg total) by mouth 5 (five) times daily. 09/28/15   Trixie Dredge, PA-C  diphenhydrAMINE (BENADRYL) 12.5 MG/5ML liquid Take 25 mg by mouth 2 (two) times daily as needed for itching.    [provider]  ferrous  sulfate 325 (65 FE) MG EC tablet Take 325 mg by mouth daily with breakfast.    [provider]  gabapentin (NEURONTIN) 100 MG capsule Take 100 mg by mouth 3 (three) times daily.    [provider]  insulin glargine (LANTUS) 100 UNIT/ML injection Inject 10-20 Units into the skin at bedtime as needed (high blood sugar). Sliding scale    [provider]  losartan (COZAAR) 100 MG tablet Take 100 mg by mouth daily.    [provider]  megestrol (MEGACE) 40 MG/ML suspension Take 400 mg by mouth 2 (two) times daily.    [provider]  metoprolol tartrate (LOPRESSOR) 25 MG tablet Take 25 mg by mouth 2 (two) times daily.  08/12/15   [provider]  nitrofurantoin, macrocrystal-monohydrate, (MACROBID) 100 MG capsule Take 100 mg by mouth 2 (two) times daily.    [provider]  nystatin (MYCOSTATIN/NYSTOP) powder APPLY EXTERNALLY TO THE AFFECTED AREA OF SKIN TWICE DAILY FOR 2 WEEKS AS NEEDED 12/19/16   [provider]  ondansetron (ZOFRAN-ODT) 4 MG disintegrating tablet Take 4 mg by mouth every 8 (eight) hours as needed for nausea or vomiting.    [provider]  oxyCODONE-acetaminophen (PERCOCET/ROXICET) 5-325 MG tablet Take 1 tablet by mouth every 8 (eight) hours as needed for severe pain.  [provider]  pantoprazole (PROTONIX) 40 MG tablet Take 40 mg by mouth daily.    [provider]  potassium chloride (K-DUR) 10 MEQ tablet Take 20 mEq by mouth 2 (two) times daily. 12/20/16   [provider]  pravastatin (PRAVACHOL) 40 MG tablet Take 40 mg by mouth daily. 08/09/17   [provider]  tamsulosin (FLOMAX) 0.4 MG CAPS capsule Take 0.4 mg by mouth daily.    [provider]  traMADol (ULTRAM) 50 MG tablet Take 1 tablet (50 mg total) by mouth every 6 (six) hours as needed. 06/02/15   Ward, Layla Maw, DO  triamcinolone cream (KENALOG) 0.5 % Apply 1 application topically daily as needed  (rash).    [provider]  Vitamin D, Ergocalciferol, (DRISDOL) 50000 UNITS CAPS capsule Take 50,000 Units by mouth every 7 (seven) days.    [provider]    Family History History reviewed. No pertinent family history.  Social History Social History   Tobacco Use  . Smoking status: Never Smoker  . Smokeless tobacco: Never Used  Substance Use Topics  . Alcohol use: No  . Drug use: No     Allergies   Sulfamethoxazole-trimethoprim; Ciprofloxacin; Contrast media [iodinated diagnostic agents]; Doxazosin; Levaquin [levofloxacin]; Macrobid [nitrofurantoin]; Penicillins; Sulfa antibiotics; Amoxicillin-pot clavulanate; and Latex   Review of Systems Review of Systems  Constitutional: Negative for appetite change and fatigue.  HENT: Negative for congestion, ear discharge and sinus pressure.   Eyes: Negative for discharge.  Respiratory: Negative for cough.   Cardiovascular: Negative for chest pain.  Gastrointestinal: Negative for abdominal pain and diarrhea.  Genitourinary: Positive for hematuria. Negative for frequency.  Musculoskeletal: Negative for back pain.  Skin: Negative for rash.  Neurological: Negative for seizures and headaches.  Psychiatric/Behavioral: Negative for hallucinations.     Physical Exam Updated Vital Signs BP (!) 148/82   Pulse 64   Temp 98.9 F (37.2 C) (Oral)   Resp 16   SpO2 99%   Physical Exam Vitals signs and nursing note reviewed.  Constitutional:      Appearance: She is well-developed.  HENT:     Head: Normocephalic.     Nose: Nose normal.  Eyes:     General: No scleral icterus.    Conjunctiva/sclera: Conjunctivae normal.  Neck:     Musculoskeletal: Neck supple.     Thyroid: No thyromegaly.  Cardiovascular:     Rate and Rhythm: Normal rate and regular rhythm.     Heart sounds: No murmur. No friction rub. No gallop.   Pulmonary:     Breath sounds: No stridor. No wheezing or rales.  Chest:     Chest wall: No  tenderness.  Abdominal:     General: There is no distension.     Tenderness: There is no abdominal tenderness. There is no rebound.  Genitourinary:    Comments: Patient will pull in her Foley bag Musculoskeletal: Normal range of motion.  Lymphadenopathy:     Cervical: No cervical adenopathy.  Skin:    Findings: No erythema or rash.  Neurological:     Mental Status: Mental status is at baseline.     Motor: No abnormal muscle tone.     Coordination: Coordination normal.  Psychiatric:        Behavior: Behavior normal.      ED Treatments / Results  Labs (all labs ordered are listed, but only abnormal results are displayed) Labs Reviewed  URINALYSIS, ROUTINE W REFLEX MICROSCOPIC - Abnormal; Notable for the following  components:      Result Value   APPearance HAZY (*)    pH 9.0 (*)    Hgb urine dipstick LARGE (*)    Protein, ur 30 (*)    Leukocytes,Ua LARGE (*)    RBC / HPF >50 (*)    WBC, UA >50 (*)    Bacteria, UA FEW (*)    All other components within normal limits  CBC WITH DIFFERENTIAL/PLATELET - Abnormal; Notable for the following components:   Hemoglobin 11.0 (*)    MCHC 29.9 (*)    All other components within normal limits  BASIC METABOLIC PANEL - Abnormal; Notable for the following components:   Glucose, Bld 126 (*)    BUN 43 (*)    GFR calc non Af Amer 54 (*)    All other components within normal limits  URINE CULTURE    EKG None  Radiology No results found.  Procedures Procedures (including critical care time)  Medications Ordered in ED Medications  cefTRIAXone (ROCEPHIN) injection 1 g (1 g Intramuscular Given 11/26/18 1910)  lidocaine (PF) (XYLOCAINE) 1 % injection (5 mLs  Given 11/26/18 1910)  sterile water (preservative free) injection (10 mLs  Given 11/26/18 1910)     Initial Impression / Assessment and Plan / ED Course  I have reviewed the triage vital signs and the nursing notes.  Pertinent labs & imaging results that were available during  my care of the patient were reviewed by me and considered in my medical decision making (see chart for details).        Showing cystitis. She will placed on Keflex and follow-up with her PCP at the end week Final Clinical Impressions(s) / ED Diagnoses   Final diagnoses:  Cystitis    ED Discharge Orders         Ordered    cephALEXin (KEFLEX) 250 MG/5ML suspension  3 times daily     11/26/18 1943           Bethann Berkshire, MD 11/26/18 1948

## 2018-11-27 ENCOUNTER — Telehealth: Payer: Self-pay | Admitting: *Deleted

## 2018-11-27 NOTE — Telephone Encounter (Signed)
Pharmacy called related to Rx: cephalexin Rx .Marland KitchenMarland KitchenEDCM clarified with EDP ([place]) to dispense as written.  Pt has several allergies and cephalexin was not listed as having anaphylactic reaction.   No further EDCM needs identified at this time.

## 2018-11-29 LAB — URINE CULTURE

## 2018-11-30 ENCOUNTER — Telehealth: Payer: Self-pay | Admitting: *Deleted

## 2018-11-30 NOTE — Progress Notes (Signed)
ED Antimicrobial Stewardship Positive Culture Follow Up   Jamie Valentine is an 85 y.o. female who presented to [facility] on 11/26/2018 with a chief complaint of  Chief Complaint  Patient presents with  . Hematuria    Recent Results (from the past 720 hour(s))  Urine Culture     Status: Abnormal   Collection Time: 11/26/18  6:37 PM  Result Value Ref Range Status   Specimen Description URINE, RANDOM  Final   Special Requests   Final    NONE Performed at [facility], [address] [place], [place] [address]    Culture (A)  Final    >=100,000 COLONIES/mL PSEUDOMONAS AERUGINOSA >=100,000 COLONIES/mL CITROBACTER FREUNDII    Report Status 11/29/2018 FINAL  Final   Organism ID, Bacteria PSEUDOMONAS AERUGINOSA (A)  Final   Organism ID, Bacteria CITROBACTER FREUNDII (A)  Final      Susceptibility   Citrobacter freundii - MIC*    CEFAZOLIN >=64 RESISTANT Resistant     CEFTRIAXONE <=1 SENSITIVE Sensitive     CIPROFLOXACIN <=0.25 SENSITIVE Sensitive     GENTAMICIN <=1 SENSITIVE Sensitive     IMIPENEM 1 SENSITIVE Sensitive     NITROFURANTOIN <=16 SENSITIVE Sensitive     TRIMETH/SULFA <=20 SENSITIVE Sensitive     PIP/TAZO <=4 SENSITIVE Sensitive     * >=100,000 COLONIES/mL CITROBACTER FREUNDII   Pseudomonas aeruginosa - MIC*    CEFTAZIDIME 4 SENSITIVE Sensitive     CIPROFLOXACIN <=0.25 SENSITIVE Sensitive     GENTAMICIN <=1 SENSITIVE Sensitive     IMIPENEM 1 SENSITIVE Sensitive     PIP/TAZO 8 SENSITIVE Sensitive     CEFEPIME 2 SENSITIVE Sensitive     * >=100,000 COLONIES/mL PSEUDOMONAS AERUGINOSA    [x]  Treated with Cephalexin, organism resistant to prescribed antimicrobial  Stop cephalexin, take fosfomycin 3 gms po x 1  ED Provider: Ronnie Doss, PA-C  [name] 11/30/2018, 12:54 PM Clinical Pharmacist Monday - Friday phone -  [phone] Saturday - Sunday phone - [phone]

## 2018-11-30 NOTE — Telephone Encounter (Signed)
Post ED Visit - Positive Culture Follow-up: Unsuccessful Patient Follow-up  Culture assessed and recommendations reviewed by:  []  Enzo Bi, Pharm.D. []  Celedonio Miyamoto, Pharm.D., BCPS AQ-ID []  Garvin Fila, Pharm.D., BCPS []  Georgina Pillion, [address].D., BCPS []  [place], [place].D., BCPS, AAHIVP []  Estella Husk, Pharm.D., BCPS, AAHIVP []  Sherlynn Carbon, PharmD []  Pollyann Samples, PharmD, BCPS  Positive urine culture, reviewed by Glendon Axe, PA-C  []  Patient discharged without antimicrobial prescription and treatment is now indicated [x]  Organism is resistant to prescribed ED discharge antimicrobial []  Patient with positive blood cultures  Plan:  Stop Cephalexin.  Take Fosfomycin 3gms PO x 1 dose.  Unable to contact patient after 3 attempts, letter will be sent to address on file  Lysle Pearl 11/30/2018, 12:30 PM

## 2019-09-08 ENCOUNTER — Emergency Department (HOSPITAL_COMMUNITY)

## 2019-09-08 ENCOUNTER — Observation Stay (HOSPITAL_COMMUNITY)
Admission: EM | Admit: 2019-09-08 | Discharge: 2019-09-13 | Disposition: E | Attending: Internal Medicine | Admitting: Internal Medicine

## 2019-09-08 DIAGNOSIS — R471 Dysarthria and anarthria: Secondary | ICD-10-CM | POA: Diagnosis not present

## 2019-09-08 DIAGNOSIS — I69354 Hemiplegia and hemiparesis following cerebral infarction affecting left non-dominant side: Secondary | ICD-10-CM | POA: Insufficient documentation

## 2019-09-08 DIAGNOSIS — J9601 Acute respiratory failure with hypoxia: Secondary | ICD-10-CM | POA: Diagnosis not present

## 2019-09-08 DIAGNOSIS — Z79899 Other long term (current) drug therapy: Secondary | ICD-10-CM | POA: Diagnosis not present

## 2019-09-08 DIAGNOSIS — R0602 Shortness of breath: Secondary | ICD-10-CM | POA: Diagnosis not present

## 2019-09-08 DIAGNOSIS — I129 Hypertensive chronic kidney disease with stage 1 through stage 4 chronic kidney disease, or unspecified chronic kidney disease: Secondary | ICD-10-CM | POA: Diagnosis not present

## 2019-09-08 DIAGNOSIS — Z91041 Radiographic dye allergy status: Secondary | ICD-10-CM | POA: Insufficient documentation

## 2019-09-08 DIAGNOSIS — K219 Gastro-esophageal reflux disease without esophagitis: Secondary | ICD-10-CM | POA: Diagnosis not present

## 2019-09-08 DIAGNOSIS — R1310 Dysphagia, unspecified: Secondary | ICD-10-CM | POA: Insufficient documentation

## 2019-09-08 DIAGNOSIS — G9340 Encephalopathy, unspecified: Secondary | ICD-10-CM

## 2019-09-08 DIAGNOSIS — F0390 Unspecified dementia without behavioral disturbance: Secondary | ICD-10-CM | POA: Diagnosis not present

## 2019-09-08 DIAGNOSIS — Z882 Allergy status to sulfonamides status: Secondary | ICD-10-CM | POA: Insufficient documentation

## 2019-09-08 DIAGNOSIS — R1113 Vomiting of fecal matter: Secondary | ICD-10-CM | POA: Insufficient documentation

## 2019-09-08 DIAGNOSIS — R627 Adult failure to thrive: Secondary | ICD-10-CM | POA: Insufficient documentation

## 2019-09-08 DIAGNOSIS — Z888 Allergy status to other drugs, medicaments and biological substances status: Secondary | ICD-10-CM | POA: Insufficient documentation

## 2019-09-08 DIAGNOSIS — Z881 Allergy status to other antibiotic agents status: Secondary | ICD-10-CM | POA: Insufficient documentation

## 2019-09-08 DIAGNOSIS — L89159 Pressure ulcer of sacral region, unspecified stage: Secondary | ICD-10-CM | POA: Insufficient documentation

## 2019-09-08 DIAGNOSIS — Z794 Long term (current) use of insulin: Secondary | ICD-10-CM | POA: Insufficient documentation

## 2019-09-08 DIAGNOSIS — Z880 Allergy status to penicillin: Secondary | ICD-10-CM | POA: Insufficient documentation

## 2019-09-08 DIAGNOSIS — I471 Supraventricular tachycardia: Secondary | ICD-10-CM | POA: Diagnosis not present

## 2019-09-08 DIAGNOSIS — Z515 Encounter for palliative care: Secondary | ICD-10-CM | POA: Diagnosis present

## 2019-09-08 DIAGNOSIS — Z9911 Dependence on respirator [ventilator] status: Secondary | ICD-10-CM | POA: Insufficient documentation

## 2019-09-08 DIAGNOSIS — E1122 Type 2 diabetes mellitus with diabetic chronic kidney disease: Secondary | ICD-10-CM | POA: Insufficient documentation

## 2019-09-08 DIAGNOSIS — Z91040 Latex allergy status: Secondary | ICD-10-CM | POA: Insufficient documentation

## 2019-09-08 DIAGNOSIS — N189 Chronic kidney disease, unspecified: Secondary | ICD-10-CM | POA: Diagnosis not present

## 2019-09-08 DIAGNOSIS — J690 Pneumonitis due to inhalation of food and vomit: Secondary | ICD-10-CM | POA: Diagnosis not present

## 2019-09-08 DIAGNOSIS — N179 Acute kidney failure, unspecified: Secondary | ICD-10-CM | POA: Insufficient documentation

## 2019-09-08 DIAGNOSIS — Z7982 Long term (current) use of aspirin: Secondary | ICD-10-CM | POA: Diagnosis not present

## 2019-09-08 DIAGNOSIS — I252 Old myocardial infarction: Secondary | ICD-10-CM | POA: Diagnosis not present

## 2019-09-08 DIAGNOSIS — Z20828 Contact with and (suspected) exposure to other viral communicable diseases: Secondary | ICD-10-CM | POA: Insufficient documentation

## 2019-09-08 DIAGNOSIS — A419 Sepsis, unspecified organism: Secondary | ICD-10-CM | POA: Diagnosis not present

## 2019-09-08 DIAGNOSIS — Z931 Gastrostomy status: Secondary | ICD-10-CM | POA: Insufficient documentation

## 2019-09-08 DIAGNOSIS — E875 Hyperkalemia: Secondary | ICD-10-CM | POA: Insufficient documentation

## 2019-09-08 DIAGNOSIS — R6521 Severe sepsis with septic shock: Secondary | ICD-10-CM

## 2019-09-08 LAB — CBC WITH DIFFERENTIAL/PLATELET
Abs Immature Granulocytes: 0.03 10*3/uL (ref 0.00–0.07)
Basophils Absolute: 0 10*3/uL (ref 0.0–0.1)
Basophils Relative: 1 %
Eosinophils Absolute: 0 10*3/uL (ref 0.0–0.5)
Eosinophils Relative: 0 %
HCT: 36.4 % (ref 36.0–46.0)
Hemoglobin: 10.1 g/dL — ABNORMAL LOW (ref 12.0–15.0)
Immature Granulocytes: 1 %
Lymphocytes Relative: 14 %
Lymphs Abs: 0.9 10*3/uL (ref 0.7–4.0)
MCH: 25.3 pg — ABNORMAL LOW (ref 26.0–34.0)
MCHC: 27.7 g/dL — ABNORMAL LOW (ref 30.0–36.0)
MCV: 91 fL (ref 80.0–100.0)
Monocytes Absolute: 0.5 10*3/uL (ref 0.1–1.0)
Monocytes Relative: 8 %
Neutro Abs: 5 10*3/uL (ref 1.7–7.7)
Neutrophils Relative %: 76 %
Platelets: 323 10*3/uL (ref 150–400)
RBC: 4 MIL/uL (ref 3.87–5.11)
RDW: 16.6 % — ABNORMAL HIGH (ref 11.5–15.5)
WBC: 6.5 10*3/uL (ref 4.0–10.5)
nRBC: 0.3 % — ABNORMAL HIGH (ref 0.0–0.2)

## 2019-09-08 LAB — URINALYSIS, ROUTINE W REFLEX MICROSCOPIC
Bilirubin Urine: NEGATIVE
Glucose, UA: 50 mg/dL — AB
Hgb urine dipstick: NEGATIVE
Ketones, ur: NEGATIVE mg/dL
Nitrite: NEGATIVE
Protein, ur: 100 mg/dL — AB
Specific Gravity, Urine: 1.019 (ref 1.005–1.030)
pH: 5 (ref 5.0–8.0)

## 2019-09-08 LAB — FIBRINOGEN: Fibrinogen: 663 mg/dL — ABNORMAL HIGH (ref 210–475)

## 2019-09-08 LAB — POCT I-STAT EG7
Acid-base deficit: 3 mmol/L — ABNORMAL HIGH (ref 0.0–2.0)
Bicarbonate: 23.8 mmol/L (ref 20.0–28.0)
Calcium, Ion: 1.24 mmol/L (ref 1.15–1.40)
HCT: 34 % — ABNORMAL LOW (ref 36.0–46.0)
Hemoglobin: 11.6 g/dL — ABNORMAL LOW (ref 12.0–15.0)
O2 Saturation: 99 %
Potassium: 6.9 mmol/L (ref 3.5–5.1)
Sodium: 133 mmol/L — ABNORMAL LOW (ref 135–145)
TCO2: 25 mmol/L (ref 22–32)
pCO2, Ven: 50.9 mmHg (ref 44.0–60.0)
pH, Ven: 7.277 (ref 7.250–7.430)
pO2, Ven: 166 mmHg — ABNORMAL HIGH (ref 32.0–45.0)

## 2019-09-08 LAB — I-STAT CHEM 8, ED
BUN: 101 mg/dL — ABNORMAL HIGH (ref 8–23)
Calcium, Ion: 1.24 mmol/L (ref 1.15–1.40)
Chloride: 103 mmol/L (ref 98–111)
Creatinine, Ser: 2.2 mg/dL — ABNORMAL HIGH (ref 0.44–1.00)
Glucose, Bld: 331 mg/dL — ABNORMAL HIGH (ref 70–99)
HCT: 34 % — ABNORMAL LOW (ref 36.0–46.0)
Hemoglobin: 11.6 g/dL — ABNORMAL LOW (ref 12.0–15.0)
Potassium: 6.9 mmol/L (ref 3.5–5.1)
Sodium: 132 mmol/L — ABNORMAL LOW (ref 135–145)
TCO2: 24 mmol/L (ref 22–32)

## 2019-09-08 LAB — COMPREHENSIVE METABOLIC PANEL
ALT: 21 U/L (ref 0–44)
AST: 49 U/L — ABNORMAL HIGH (ref 15–41)
Albumin: 1.9 g/dL — ABNORMAL LOW (ref 3.5–5.0)
Alkaline Phosphatase: 74 U/L (ref 38–126)
Anion gap: 13 (ref 5–15)
BUN: 98 mg/dL — ABNORMAL HIGH (ref 8–23)
CO2: 19 mmol/L — ABNORMAL LOW (ref 22–32)
Calcium: 9.5 mg/dL (ref 8.9–10.3)
Chloride: 101 mmol/L (ref 98–111)
Creatinine, Ser: 2.43 mg/dL — ABNORMAL HIGH (ref 0.44–1.00)
GFR calc Af Amer: 20 mL/min — ABNORMAL LOW (ref >60)
GFR calc non Af Amer: 17 mL/min — ABNORMAL LOW (ref >60)
Glucose, Bld: 337 mg/dL — ABNORMAL HIGH (ref 70–99)
Potassium: 6.9 mmol/L (ref 3.5–5.1)
Sodium: 133 mmol/L — ABNORMAL LOW (ref 135–145)
Total Bilirubin: 0.7 mg/dL (ref 0.3–1.2)
Total Protein: 6.6 g/dL (ref 6.5–8.1)

## 2019-09-08 LAB — D-DIMER, QUANTITATIVE: D-Dimer, Quant: 12.95 ug/mL-FEU — ABNORMAL HIGH (ref 0.00–0.50)

## 2019-09-08 LAB — RESPIRATORY PANEL BY RT PCR (FLU A&B, COVID)
Influenza A by PCR: NEGATIVE
Influenza B by PCR: NEGATIVE
SARS Coronavirus 2 by RT PCR: NEGATIVE

## 2019-09-08 LAB — TRIGLYCERIDES: Triglycerides: 75 mg/dL (ref <150)

## 2019-09-08 LAB — LACTATE DEHYDROGENASE: LDH: 333 U/L — ABNORMAL HIGH (ref 98–192)

## 2019-09-08 LAB — C-REACTIVE PROTEIN: CRP: 14.8 mg/dL — ABNORMAL HIGH (ref <1.0)

## 2019-09-08 LAB — PROCALCITONIN: Procalcitonin: 2.96 ng/mL

## 2019-09-08 LAB — FERRITIN: Ferritin: 119 ng/mL (ref 11–307)

## 2019-09-08 LAB — TROPONIN I (HIGH SENSITIVITY): Troponin I (High Sensitivity): 48 ng/L — ABNORMAL HIGH (ref <18)

## 2019-09-08 LAB — BRAIN NATRIURETIC PEPTIDE: B Natriuretic Peptide: 346.6 pg/mL — ABNORMAL HIGH (ref 0.0–100.0)

## 2019-09-08 LAB — LACTIC ACID, PLASMA: Lactic Acid, Venous: 6.1 mmol/L (ref 0.5–1.9)

## 2019-09-08 MED ORDER — GLYCOPYRROLATE 1 MG PO TABS
1.0000 mg | ORAL_TABLET | ORAL | Status: DC | PRN
  Filled 2019-09-08: qty 1

## 2019-09-08 MED ORDER — GLYCOPYRROLATE 0.2 MG/ML IJ SOLN: 0.2000 mg | INTRAMUSCULAR | Status: DC | PRN

## 2019-09-08 MED ORDER — ETOMIDATE 2 MG/ML IV SOLN
INTRAVENOUS | Status: AC | PRN
  Administered 2019-09-08: 20 mg via INTRAVENOUS

## 2019-09-08 MED ORDER — METRONIDAZOLE IN NACL 5-0.79 MG/ML-% IV SOLN
500.0000 mg | Freq: Once | INTRAVENOUS | Status: DC
  Filled 2019-09-08: qty 100

## 2019-09-08 MED ORDER — ACETAMINOPHEN 325 MG PO TABS: 650.0000 mg | ORAL_TABLET | Freq: Four times a day (QID) | ORAL | Status: DC | PRN

## 2019-09-08 MED ORDER — MORPHINE SULFATE (PF) 2 MG/ML IV SOLN: 2.0000 mg | INTRAVENOUS | Status: DC | PRN

## 2019-09-08 MED ORDER — ONDANSETRON HCL 4 MG/2ML IJ SOLN
4.0000 mg | Freq: Four times a day (QID) | INTRAMUSCULAR | Status: DC | PRN
  Administered 2019-09-08: 4 mg via INTRAVENOUS
  Filled 2019-09-08: qty 2

## 2019-09-08 MED ORDER — LORAZEPAM 2 MG/ML PO CONC: 1.0000 mg | ORAL | Status: DC | PRN

## 2019-09-08 MED ORDER — POLYVINYL ALCOHOL 1.4 % OP SOLN: 1.0000 [drp] | Freq: Four times a day (QID) | OPHTHALMIC | Status: DC | PRN

## 2019-09-08 MED ORDER — GLYCOPYRROLATE 0.2 MG/ML IJ SOLN
0.2000 mg | INTRAMUSCULAR | Status: DC | PRN
  Administered 2019-09-08: 0.2 mg via INTRAVENOUS
  Filled 2019-09-08: qty 1

## 2019-09-08 MED ORDER — GLYCOPYRROLATE 1 MG PO TABS: 1.0000 mg | ORAL_TABLET | ORAL | Status: DC | PRN

## 2019-09-08 MED ORDER — SODIUM CHLORIDE 0.9 % IV SOLN
2.0000 g | Freq: Once | INTRAVENOUS | Status: DC
  Filled 2019-09-08: qty 2

## 2019-09-08 MED ORDER — HALOPERIDOL 1 MG PO TABS: 0.5000 mg | ORAL_TABLET | ORAL | Status: DC | PRN

## 2019-09-08 MED ORDER — BIOTENE DRY MOUTH MT LIQD: 15.0000 mL | OROMUCOSAL | Status: DC | PRN

## 2019-09-08 MED ORDER — SODIUM CHLORIDE 0.9 % IV SOLN
1.0000 g | Freq: Three times a day (TID) | INTRAVENOUS | Status: DC
  Filled 2019-09-08: qty 1

## 2019-09-08 MED ORDER — VANCOMYCIN HCL IN DEXTROSE 1-5 GM/200ML-% IV SOLN
1000.0000 mg | Freq: Once | INTRAVENOUS | Status: DC
  Filled 2019-09-08: qty 200

## 2019-09-08 MED ORDER — ACETAMINOPHEN 650 MG RE SUPP: 650.0000 mg | Freq: Four times a day (QID) | RECTAL | Status: DC | PRN

## 2019-09-08 MED ORDER — DEXTROSE 5 % IV SOLN: INTRAVENOUS | Status: DC

## 2019-09-08 MED ORDER — ROCURONIUM BROMIDE 50 MG/5ML IV SOLN
INTRAVENOUS | Status: AC | PRN
  Administered 2019-09-08: 100 mg via INTRAVENOUS

## 2019-09-08 MED ORDER — MORPHINE 100MG IN NS 100ML (1MG/ML) PREMIX INFUSION
0.0000 mg/h | INTRAVENOUS | Status: DC
  Filled 2019-09-08: qty 100

## 2019-09-08 MED ORDER — LORAZEPAM 2 MG/ML IJ SOLN
1.0000 mg | INTRAMUSCULAR | Status: DC | PRN
  Administered 2019-09-08: 1 mg via INTRAVENOUS
  Filled 2019-09-08: qty 1

## 2019-09-08 MED ORDER — LORAZEPAM 1 MG PO TABS: 1.0000 mg | ORAL_TABLET | ORAL | Status: DC | PRN

## 2019-09-08 MED ORDER — CALCIUM GLUCONATE-NACL 1-0.675 GM/50ML-% IV SOLN
1.0000 g | Freq: Once | INTRAVENOUS | Status: AC
  Administered 2019-09-08: 1000 mg via INTRAVENOUS
  Filled 2019-09-08: qty 50

## 2019-09-08 MED ORDER — HALOPERIDOL LACTATE 5 MG/ML IJ SOLN
0.5000 mg | INTRAMUSCULAR | Status: DC | PRN
  Administered 2019-09-08: 0.5 mg via INTRAVENOUS
  Filled 2019-09-08: qty 1

## 2019-09-08 MED ORDER — PROPOFOL 1000 MG/100ML IV EMUL
INTRAVENOUS | Status: AC
  Administered 2019-09-08: 5 ug/kg/min via INTRAVENOUS
  Filled 2019-09-08: qty 100

## 2019-09-08 MED ORDER — ONDANSETRON 4 MG PO TBDP: 4.0000 mg | ORAL_TABLET | Freq: Four times a day (QID) | ORAL | Status: DC | PRN

## 2019-09-08 MED ORDER — DIPHENHYDRAMINE HCL 50 MG/ML IJ SOLN: 25.0000 mg | INTRAMUSCULAR | Status: DC | PRN

## 2019-09-08 MED ORDER — PROPOFOL 1000 MG/100ML IV EMUL: 5.0000 ug/kg/min | INTRAVENOUS | Status: DC

## 2019-09-08 MED ORDER — MORPHINE BOLUS VIA INFUSION
5.0000 mg | INTRAVENOUS | Status: DC | PRN
  Filled 2019-09-08: qty 5

## 2019-09-08 MED ORDER — INSULIN ASPART 100 UNIT/ML IV SOLN
5.0000 [IU] | Freq: Once | INTRAVENOUS | Status: AC
  Administered 2019-09-08: 5 [IU] via INTRAVENOUS

## 2019-09-08 MED ORDER — VANCOMYCIN HCL 750 MG/150ML IV SOLN: 750.0000 mg | INTRAVENOUS | Status: DC

## 2019-09-08 MED ORDER — POLYVINYL ALCOHOL 1.4 % OP SOLN
1.0000 [drp] | Freq: Four times a day (QID) | OPHTHALMIC | Status: DC | PRN
  Filled 2019-09-08: qty 15

## 2019-09-08 MED ORDER — SODIUM CHLORIDE 0.9 % IV BOLUS
1000.0000 mL | Freq: Once | INTRAVENOUS | Status: AC
  Administered 2019-09-08: 1000 mL via INTRAVENOUS

## 2019-09-08 MED ORDER — HALOPERIDOL LACTATE 2 MG/ML PO CONC
0.5000 mg | ORAL | Status: DC | PRN
  Filled 2019-09-08: qty 0.3

## 2019-09-08 MED ORDER — MORPHINE BOLUS VIA INFUSION
2.0000 mg | INTRAVENOUS | Status: DC | PRN
  Filled 2019-09-08: qty 2

## 2019-09-08 MED ORDER — MORPHINE 100MG IN NS 100ML (1MG/ML) PREMIX INFUSION
5.0000 mg/h | INTRAVENOUS | Status: DC
  Administered 2019-09-08: 5 mg/h via INTRAVENOUS

## 2019-09-11 LAB — CULTURE, BLOOD (ROUTINE X 2)

## 2019-09-13 LAB — CULTURE, BLOOD (ROUTINE X 2): Culture: NO GROWTH

## 2019-09-13 NOTE — ED Triage Notes (Signed)
Pt from home for respiratory distress x 3 days. Pt is a hospice pt; unknown code status. EMS nasally intubated for gag reflux and vomit in airway. Initial sats 70s, improved to 85%. IO to R tibia, chronic foley in place, CBG 407. Family reported emesis x 3 days. GCS 3

## 2019-09-13 NOTE — Death Summary Note (Addendum)
  Name: Jamie Valentine MRN: [id number] DOB: November 02, 1927 86 y.o.  Date of Admission: 09-18-2019  6:51 AM Date of Discharge: Sep 18, 2019 Attending Physician: Sid Falcon, MD;Guil*  Discharge Diagnosis: Active Problems:   End of life care  Cause of death: Acute hypoxic respiratory failure Time of death: September 18, 2019 1:20PM  Disposition and follow-up:   Ms.Jamie Valentine was discharged from [facility] in expired condition.    Hospital Course: Ms.Jamie Valentine is a 86 yo F w/ PMH of CVA w/ L hemiparesis, dysarthria, dysphagia s/p PEG, chronic sacral pressure ulcer, CKD< DM, GERD, HTN, NSTEMI who presented to [facility] with respiratory distress. She had a recent admission at [facility] where she was found to have sepsis due to aspiration pneumonia. She was discharged home with hospice. She had acute worsening respiratory distress and she was intubated and brought to [facility]. She was found to have feculent emesis coming from her trach. PCCM was consulted who discussed with family and decision was made to transition to comfort care. She was placed on morphine drip and she passed shortly after one-way extubation.  Signed: Mosetta Anis, MD September 18, 2019, 1:28 PM  Pager: [phone]

## 2019-09-13 NOTE — Progress Notes (Signed)
Code Sepsis monitoring discontinued due to cancellation.  Secure chat sent to MD and bedside RN, plan is to DC sepsis due to withdrawing life support and transition to comfort care

## 2019-09-13 NOTE — ED Notes (Signed)
Family remains at bedside. RRT at bedside for extubation.

## 2019-09-13 NOTE — ED Notes (Signed)
Propofol stopped per MD order. 

## 2019-09-13 NOTE — H&P (Addendum)
Date: 2019/10/01               Patient Name:  Jamie Valentine MRN: [id number]  DOB: 06-15-1928 Age / Sex: 86 y.o., female   PCP: Annita Brod, MD         Medical Service: Internal Medicine Teaching Service         Attending Physician: Dr. Terrilee Files, MD    First Contact: Dr. Sande Brothers Pager: [phone]  Second Contact: Dr. Nedra Hai Pager: [phone]       After Hours (After 5p/  First Contact Pager: [phone]  weekends / holidays): Second Contact Pager: [phone]   Chief Complaint: Multi Organ Failure  History of Present Illness:   Jamie Valentine is a 86 y/o female with complex PMH including stroke, dementia, and DM, who presents to the [facility] from home hospice to transition to comfort care. The HPI was gathered through chart review and speaking with the family. Patient presented from home home hospice after 3 days of respiratory distress. EMS was called after patient was vomiting liquid stools and was nasally intubated due to difficulty visualizing the patient's vocal chords. She was taken to the [facility] and found to be in renal failure with a creatinine of 2.2, hyperkalemia with a potassium of 6.9, and she was unable to protect her airway. PCCM was consulted and patient was intubated in order for family to gather before withdrawing care. IMTS spoke with the her two granddaughters (Road Runner and Marylene Land) and they wish to proceed with extubation and comfort care.    Meds:  No outpatient medications have been marked as taking for the 01-Oct-2019 encounter [facility] Encounter).   Allergies: Allergies as of 10/01/2019 - Review Complete 11/26/2018  Allergen Reaction Noted  . Sulfamethoxazole-trimethoprim Hives and Itching 04/15/2015  . Ciprofloxacin  06/01/2015  . Contrast media [iodinated diagnostic agents]  06/01/2015  . Doxazosin Nausea Only 05/24/2017  . Levaquin [levofloxacin]  06/01/2015  . Macrobid [nitrofurantoin]  06/01/2015  . Penicillins  12/12/2014  . Sulfa antibiotics  06/01/2015  .  Amoxicillin-pot clavulanate Itching and Rash 12/12/2014  . Latex Itching and Rash 12/12/2014   Past Medical History:  Diagnosis Date  . Anemia   . Cystitis   . Dementia (HCC)   . Diabetes mellitus without complication (HCC)   . Hypertension   . Stroke (HCC)   . UTI (urinary tract infection)     Family History:  Did not due to patient's unresponsiveness and family's disposition at bedside.   Social History:  Did not obtain due to patient's unresponsiveness and family's disposition bedside.   Review of Systems: A complete ROS was negative except as per HPI.   Physical Exam: Blood pressure 135/70, pulse (!) 106, temperature 98 F (36.7 C), temperature source Temporal, resp. rate 18, height 5\' 7"  (1.702 m), weight 68 kg, SpO2 97 %.  Physical Exam Vitals and nursing note reviewed.  Constitutional:      Appearance: She is normal weight. She is ill-appearing and toxic-appearing.     Comments: Patient unresponsive at bedside. Family at bedside tearful quite understandably given their grandmother's condition.   HENT:     Head: Normocephalic and atraumatic.     Nose: Nose normal.     Comments: NG tube draining brown liquid.     Mouth/Throat:     Mouth: Mucous membranes are moist.     Pharynx: No oropharyngeal exudate.  Eyes:     General:        Right eye: No  discharge.        Left eye: No discharge.     Comments:  Pupils fixed and dilated.   Cardiovascular:     Rate and Rhythm: Regular rhythm. Tachycardia present.  Pulmonary:     Comments: Patient breathing with assistance of the ventilator, RR 18 Abdominal:     General: Abdomen is flat. There is no distension.  Skin:    General: Skin is warm.     Coloration: Skin is not jaundiced.     Findings: No bruising or lesion.  Neurological:     Comments: Patient unresponsive to visual or physical stimuli. Eyes are fixed, unblinking, and dilated.     EKG: personally reviewed my interpretation is atrial tachycardia with left  anterior fascicular block and ventricular hypertrophy.   Assessment & Plan by Problem: Active Problems:   * No active hospital problems. *  Jamie Valentine is an 86 y/o female, with a complicated PMH, who presents to the [facility] for comfort care 2/2 to multiorgan failure.   Multi Organ Failure:  Patient admitted for comfort care after coming in from home hospice 2/2 to 3 days of respiratory distress, feculent emesis, and renal failure. She is currently DNR and family wishes to extubate and continue with comfort care at this time.  - Patient made DNR - Ordered end of life palliative orders - Extubation per PCCM  Dispo: Admit patient to Observation with expected length of stay less than 2 midnights.  Signed: Maudie Mercury, MD 09-Sep-2019, 11:14 AM  Pager: [phone]

## 2019-09-13 NOTE — Consult Note (Addendum)
PULMONARY / CRITICAL CARE MEDICINE   NAME:  Jamie Valentine, MRN:  [id number], DOB:  [birth year], LOS: 0 ADMISSION DATE:  09/21/2019, CONSULTATION DATE: 2019-09-21 REFERRING MD: Emergency department physician, CHIEF COMPLAINT: Vent dependent respiratory failure  BRIEF HISTORY:    86 year old with a plethora of health issues failure to thrive intubated plan to withdraw care emergency room due to futile care. HISTORY OF PRESENT ILLNESS   86 year old female reportedly on hospice service you been bedridden for 3 days with fever worsening respiratory distress.  EMS was activated found her to be vomiting stool due to the nasotracheal intubation noted to have copious stool in the airway during intubation.  Transferred to [facility] found to be in renal failure with a creatinine of 2.2 hyperkalemia potassium of 6.9 unable to protect her airway.  Pulmonary critical care was called to the bedside.G-tube is met with the family the plan is for withdrawal care in the emergency room without further interventions with goal of care being comfort SIGNIFICANT PAST MEDICAL HISTORY   Stroke Cystitis Diabetes mellitus Dementia Anemia Hypertension ESBL UTI Anemia  SIGNIFICANT EVENTS:  Status post respiratory arrest after 3days failure to thrive with a plethora of health issues and suspected bowel obstruction aspiration STUDIES:   None CULTURES:  None  ANTIBIOTICS:  None  LINES/TUBES:  2019/09/21 endotracheal tube CONSULTANTS:  2019/09/21 pulmonary critical care SUBJECTIVE:  86 year old female with a plethora of health issues currently and plan for withdrawal care goals of care being comfort  CONSTITUTIONAL: BP 127/70   Pulse (!) 130   Temp 98 F (36.7 C) (Temporal)   Resp 16   Ht _0  (1.702 m)   Wt 68 kg Comment: from Aug 2020 records, please update  SpO2 100%   BMI 23.49 kg/m   No intake/output data recorded.     Vent Mode: PRVC FiO2 (%):  [100 %] 100 % Set Rate:  [18 bmp] 18  bmp Vt Set:  [420 mL] 420 mL PEEP:  [5 cmH20] 5 cmH20 Plateau Pressure:  [21 cmH20] 21 cmH20  PHYSICAL EXAM: General: Elderly obese female who is currently sedated on full mechanical ventilatory support Neuro: Unresponsive to stimulus HEENT: Endotracheal tube gastric tube in place gastric tube has brown material in it Cardiovascular: Heart sounds are distant Lungs: Coarse rhonchi Abdomen: Distended without bowel sounds Musculoskeletal: Intact Skin: Cool  RESOLVED PROBLEM LIST   ASSESSMENT AND PLAN   Vent dependent respiratory failure in the setting of aspiration of fecal material. Plan is for withdrawal care due to multiple health issues and advanced age.  Presumed bowel obstruction Currently scheduled for withdrawal of care with goal of care being comfort  SUMMARY OF TODAY'S PLAN:  86 year old [place] female with multiple health issues who is reportedly on hospice care mainly in bed for 3 days respiratory distress.  EMS arrived she had vomited brown liquid stools and was nasally intubated as they are unable to visualize cords.  Transferred to [facility] to the emergency department.  She had a IO in the lower extremity.  At time of arrival of the pulmonary critical care team he is on ventilator with blood pressure 137/63 heart rate 127 100% FiO2.  Dr. Delsa Sale was discussed with family withdrawal care and she has a potassium of 6.7 elevated creatinine and multiple health issues that are untreatable.  Best Practice / Goals of Care / Disposition.   DVT PROPHYLAXIS: Sequential high SUP: PPI NUTRITION: N.p.o. MOBILITY: Bedrest GOALS OF CARE: Currently she is a full code  plan to transition to comfort care and DNR FAMILY DISCUSSIONS: Ongoing discussions with Dr. Nelda Marseille and family DISPOSITION currently in the emergency room with plan to withdraw care in the emergency room.  LABS  Glucose No results for input(s): GLUCAP in the last 168 hours.  BMET Recent Labs  Lab 10/01/2019 0730  10/01/19 0753  NA 133*  132* 133*  K 6.9*  6.9* 6.9*  CL 103 101  CO2  --  19*  BUN 101* 98*  CREATININE 2.20* 2.43*  GLUCOSE 331* 337*    Liver Enzymes Recent Labs  Lab 2019-10-01 0753  AST 49*  ALT 21  ALKPHOS 74  BILITOT 0.7  ALBUMIN 1.9*    Electrolytes Recent Labs  Lab 10-01-19 0753  CALCIUM 9.5    CBC Recent Labs  Lab 10-01-2019 0730 10-01-19 0753  WBC  --  6.5  HGB 11.6*  11.6* 10.1*  HCT 34.0*  34.0* 36.4  PLT  --  323    ABG No results for input(s): PHART, PCO2ART, PO2ART in the last 168 hours.  Coag's No results for input(s): APTT, INR in the last 168 hours.  Sepsis Markers Recent Labs  Lab 01-Oct-2019 0753  LATICACIDVEN 6.1*    Cardiac Enzymes No results for input(s): TROPONINI, PROBNP in the last 168 hours.  PAST MEDICAL HISTORY :   She  has a past medical history of Anemia, Cystitis, Dementia ([place]), Diabetes mellitus without complication ([place]), Hypertension, Stroke ([place]), and UTI (urinary tract infection).  PAST SURGICAL HISTORY:  She  has a past surgical history that includes Joint replacement; Abdominal hysterectomy; Eye surgery; and Bladder surgery.  Allergies  Allergen Reactions  . Sulfamethoxazole-Trimethoprim Hives and Itching  . Ciprofloxacin   . Contrast Media [Iodinated Diagnostic Agents]   . Doxazosin Nausea Only  . Levaquin [Levofloxacin]   . Macrobid [Nitrofurantoin]   . Penicillins     ChHILDHOOD ALLERGY/unknown Has patient had a PCN reaction causing immediate rash, facial/tongue/throat swelling, SOB or lightheadedness with hypotension: unknown  Has patient had a PCN reaction causing severe rash involving mucus membranes or skin necrosis: unknown  Has patient had a PCN reaction that required hospitalization unknown Has patient had a PCN reaction occurring within the last 10 years: unknown If all of the above answers are "NO", then may proceed with Cephalosporin use.   . Sulfa Antibiotics   . Amoxicillin-Pot  Clavulanate Itching and Rash  . Latex Itching and Rash    No current facility-administered medications on file prior to encounter.   Current Outpatient Medications on File Prior to Encounter  Medication Sig  . acetaminophen (TYLENOL) 650 MG CR tablet Take 650 mg by mouth every 8 (eight) hours as needed for pain.  Marland Kitchen aspirin EC 81 MG tablet Take 81 mg by mouth daily.  . cephALEXin (KEFLEX) 250 MG/5ML suspension Place 10 mLs (500 mg total) into feeding tube 3 (three) times daily.  . clotrimazole (MYCELEX) 10 MG troche Take 1 tablet (10 mg total) by mouth 5 (five) times daily.  . diphenhydrAMINE (BENADRYL) 12.5 MG/5ML liquid Take 25 mg by mouth 2 (two) times daily as needed for itching.  . ferrous sulfate 325 (65 FE) MG EC tablet Take 325 mg by mouth daily with breakfast.  . gabapentin (NEURONTIN) 100 MG capsule Take 100 mg by mouth 3 (three) times daily.  . insulin glargine (LANTUS) 100 UNIT/ML injection Inject 10-20 Units into the skin at bedtime as needed (high blood sugar). Sliding scale  . losartan (COZAAR) 100 MG tablet Take  100 mg by mouth daily.  . megestrol (MEGACE) 40 MG/ML suspension Take 400 mg by mouth 2 (two) times daily.  . metoprolol tartrate (LOPRESSOR) 25 MG tablet Take 25 mg by mouth 2 (two) times daily.   . nitrofurantoin, macrocrystal-monohydrate, (MACROBID) 100 MG capsule Take 100 mg by mouth 2 (two) times daily.  Marland Kitchen nystatin (MYCOSTATIN/NYSTOP) powder APPLY EXTERNALLY TO THE AFFECTED AREA OF SKIN TWICE DAILY FOR 2 WEEKS AS NEEDED  . ondansetron (ZOFRAN-ODT) 4 MG disintegrating tablet Take 4 mg by mouth every 8 (eight) hours as needed for nausea or vomiting.  Marland Kitchen oxyCODONE-acetaminophen (PERCOCET/ROXICET) 5-325 MG tablet Take 1 tablet by mouth every 8 (eight) hours as needed for severe pain.  . pantoprazole (PROTONIX) 40 MG tablet Take 40 mg by mouth daily.  . potassium chloride (K-DUR) 10 MEQ tablet Take 20 mEq by mouth 2 (two) times daily.  . pravastatin (PRAVACHOL) 40 MG  tablet Take 40 mg by mouth daily.  . tamsulosin (FLOMAX) 0.4 MG CAPS capsule Take 0.4 mg by mouth daily.  . traMADol (ULTRAM) 50 MG tablet Take 1 tablet (50 mg total) by mouth every 6 (six) hours as needed.  . triamcinolone cream (KENALOG) 0.5 % Apply 1 application topically daily as needed (rash).  . Vitamin D, Ergocalciferol, (DRISDOL) 50000 UNITS CAPS capsule Take 50,000 Units by mouth every 7 (seven) days.    FAMILY HISTORY:   Her family history is not on file.  SOCIAL HISTORY:  She  reports that she has never smoked. She has never used smokeless tobacco. She reports that she does not drink alcohol or use drugs.  REVIEW OF SYSTEMS:    Ferol Luz Minor ACNP Acute Care Nurse Practitioner [place] Please consult [place] 20-Sep-2019, 9:55 AM  Attending Note:  86 year old female with extensive PMH who was on hospice getting morphine at home that aspirated and was intubated in the field since no paper work was found.  PCCM consulted to admit for respiratory failure, renal failure and septic shock.  On exam, patient is an elderly frail female with little to no response with diffuse crackles and purulent material from her ETT.  I reviewed CXR myself, ETT is ok and diffuse infiltrate noted.  Discussed with PCCM-NP.  Daughter is bedside and grand daughter is on the phone (they are both the POAs).  We had an extensive discussion about the patient's condition prior to this and now under current circumstances.  They are understandably upset that she was intubated but no paper work was available per EMS.  After a long discussion, decision was made to proceed with comfort care in the ER.  Will make DNR.  Start morphine for comfort in the ER and once family arrive the RN is to release the withdrawal orderset and patient is to be extubated in the ER.  This was relayed to Dr. Melina Copa the [place] and the ER RN.  PCCM will sign off.  The patient is critically ill with multiple organ  systems failure and requires high complexity decision making for assessment and support, frequent evaluation and titration of therapies, application of advanced monitoring technologies and extensive interpretation of multiple databases.   Critical Care Time devoted to patient care services described in this note is  40  Minutes. This time reflects time of care of this signee Dr Jennet Maduro. This critical care time does not reflect procedure time, or teaching time or supervisory time of PA/NP/Med student/Med Resident etc but could involve care  discussion time.  Rush Farmer, M.D. [facility] Pulmonary/Critical Care Medicine.

## 2019-09-13 NOTE — ED Notes (Addendum)
TOD 1320. RN witness [place].

## 2019-09-13 NOTE — Progress Notes (Signed)
Assisted with Ms. Jamie Valentine daughter in consultation room A. Daughter left to get breakfast and will return shortly. RN has contact information for daughter. Will inform incoming on call chaplain.  Rev. Dwight.

## 2019-09-13 NOTE — ED Provider Notes (Signed)
[facility]   CSN: [id number] Arrival date & time: 07-23-19  [id number]     History Chief Complaint  Patient presents with  . Respiratory Distress    Catheryn BaconHelen L Valentine is a 86 y.o. female.  5 caveat secondary to being intubated.  86 year old reportedly on hospice.  Family called EMS for respiratory distress.  Apparently she been lying in bed for 3 days with respiratory distress.  On EMS arrival she started vomiting brown liquid and was nasally intubated, initial saturation 70% room air.  I/O right lower extremity.  No further information available.  The history is provided by the EMS personnel.  Shortness of Breath Severity:  Unable to specify      Past Medical History:  Diagnosis Date  . Anemia   . Cystitis   . Dementia (HCC)   . Diabetes mellitus without complication (HCC)   . Hypertension   . Stroke (HCC)   . UTI (urinary tract infection)     There are no problems to display for this patient.   Past Surgical History:  Procedure Laterality Date  . ABDOMINAL HYSTERECTOMY    . BLADDER SURGERY    . EYE SURGERY    . JOINT REPLACEMENT       OB History   No obstetric history on file.     No family history on file.  Social History   Tobacco Use  . Smoking status: Never Smoker  . Smokeless tobacco: Never Used  Substance Use Topics  . Alcohol use: No  . Drug use: No    Home Medications Prior to Admission medications   Medication Sig Start Date End Date Taking? Authorizing Provider  acetaminophen (TYLENOL) 650 MG CR tablet Take 650 mg by mouth every 8 (eight) hours as needed for pain.    [provider]  aspirin EC 81 MG tablet Take 81 mg by mouth daily.    [provider]  cephALEXin (KEFLEX) 250 MG/5ML suspension Place 10 mLs (500 mg total) into feeding tube 3 (three) times daily. 11/26/18   Bethann BerkshireZammit, Joseph, MD  clotrimazole (MYCELEX) 10 MG troche Take 1 tablet (10 mg total) by mouth 5 (five)  times daily. 09/28/15   Trixie DredgeWest, Emily, PA-C  diphenhydrAMINE (BENADRYL) 12.5 MG/5ML liquid Take 25 mg by mouth 2 (two) times daily as needed for itching.    [provider]  ferrous sulfate 325 (65 FE) MG EC tablet Take 325 mg by mouth daily with breakfast.    [provider]  gabapentin (NEURONTIN) 100 MG capsule Take 100 mg by mouth 3 (three) times daily.    [provider]  insulin glargine (LANTUS) 100 UNIT/ML injection Inject 10-20 Units into the skin at bedtime as needed (high blood sugar). Sliding scale    [provider]  losartan (COZAAR) 100 MG tablet Take 100 mg by mouth daily.    [provider]  megestrol (MEGACE) 40 MG/ML suspension Take 400 mg by mouth 2 (two) times daily.    [provider]  metoprolol tartrate (LOPRESSOR) 25 MG tablet Take 25 mg by mouth 2 (two) times daily.  08/12/15   [provider]  nitrofurantoin, macrocrystal-monohydrate, (MACROBID) 100 MG capsule Take 100 mg by mouth 2 (two) times daily.    [provider]  nystatin (MYCOSTATIN/NYSTOP) powder APPLY EXTERNALLY TO THE AFFECTED AREA OF SKIN TWICE DAILY FOR 2 WEEKS AS NEEDED 12/19/16   [provider]  ondansetron (ZOFRAN-ODT) 4 MG disintegrating tablet Take 4  mg by mouth every 8 (eight) hours as needed for nausea or vomiting.    [provider]  oxyCODONE-acetaminophen (PERCOCET/ROXICET) 5-325 MG tablet Take 1 tablet by mouth every 8 (eight) hours as needed for severe pain.    [provider]  pantoprazole (PROTONIX) 40 MG tablet Take 40 mg by mouth daily.    [provider]  potassium chloride (K-DUR) 10 MEQ tablet Take 20 mEq by mouth 2 (two) times daily. 12/20/16   [provider]  pravastatin (PRAVACHOL) 40 MG tablet Take 40 mg by mouth daily. 08/09/17   [provider]  tamsulosin (FLOMAX) 0.4 MG CAPS capsule Take 0.4 mg by mouth daily.    [provider]  traMADol (ULTRAM) 50 MG  tablet Take 1 tablet (50 mg total) by mouth every 6 (six) hours as needed. 06/02/15   Ward, Delice Bison, DO  triamcinolone cream (KENALOG) 0.5 % Apply 1 application topically daily as needed (rash).    [provider]  Vitamin D, Ergocalciferol, (DRISDOL) 50000 UNITS CAPS capsule Take 50,000 Units by mouth every 7 (seven) days.    [provider]    Allergies    Sulfamethoxazole-trimethoprim, Ciprofloxacin, Contrast media [iodinated diagnostic agents], Doxazosin, Levaquin [levofloxacin], Macrobid [nitrofurantoin], Penicillins, Sulfa antibiotics, Amoxicillin-pot clavulanate, and Latex  Review of Systems   Review of Systems  Unable to perform ROS: Intubated  Respiratory: Positive for shortness of breath.     Physical Exam Updated Vital Signs BP 135/70   Pulse (!) 106   Temp 98 F (36.7 C) (Temporal)   Resp (!) 0   Ht 5\' 7"  (1.702 m)   Wt 68 kg Comment: from Aug 2020 records, please update  SpO2 97%   BMI 23.49 kg/m   Physical Exam Vitals and nursing note reviewed.  Constitutional:      Appearance: She is well-developed. She is ill-appearing.  HENT:     Head: Normocephalic and atraumatic.     Nose:     Comments: Nasally intubated    Mouth/Throat:     Comments: Brown secretions pooling in oropharynx. Eyes:     Conjunctiva/sclera: Conjunctivae normal.  Cardiovascular:     Rate and Rhythm: Regular rhythm. Tachycardia present.     Heart sounds: No murmur.  Pulmonary:     Effort: No respiratory distress.     Breath sounds: Rhonchi present.  Abdominal:     Palpations: Abdomen is soft.     Tenderness: There is no abdominal tenderness. There is no guarding or rebound.  Musculoskeletal:        General: Normal range of motion.     Cervical back: Neck supple.     Right lower leg: No edema.     Left lower leg: No edema.     Comments: I/O right lower leg.  Skin:    General: Skin is warm and dry.     Capillary Refill: Capillary refill takes less than 2 seconds.    Neurological:     Comments: Patient is not responsive.  She is spontaneously breathing.  No withdraw to pain in all 4 extremities.     ED Results / Procedures / Treatments   Labs (all labs ordered are listed, but only abnormal results are displayed) Labs Reviewed  LACTIC ACID, PLASMA - Abnormal; Notable for the following components:      Result Value   Lactic Acid, Venous 6.1 (*)    All other components within normal limits  CBC WITH DIFFERENTIAL/PLATELET - Abnormal; Notable for the following  components:   Hemoglobin 10.1 (*)    MCH 25.3 (*)    MCHC 27.7 (*)    RDW 16.6 (*)    nRBC 0.3 (*)    All other components within normal limits  COMPREHENSIVE METABOLIC PANEL - Abnormal; Notable for the following components:   Sodium 133 (*)    Potassium 6.9 (*)    CO2 19 (*)    Glucose, Bld 337 (*)    BUN 98 (*)    Creatinine, Ser 2.43 (*)    Albumin 1.9 (*)    AST 49 (*)    GFR calc non Af Amer 17 (*)    GFR calc Af Amer 20 (*)    All other components within normal limits  D-DIMER, QUANTITATIVE (NOT AT [facility]) - Abnormal; Notable for the following components:   D-Dimer, Quant 12.95 (*)    All other components within normal limits  LACTATE DEHYDROGENASE - Abnormal; Notable for the following components:   LDH 333 (*)    All other components within normal limits  FIBRINOGEN - Abnormal; Notable for the following components:   Fibrinogen 663 (*)    All other components within normal limits  C-REACTIVE PROTEIN - Abnormal; Notable for the following components:   CRP 14.8 (*)    All other components within normal limits  BRAIN NATRIURETIC PEPTIDE - Abnormal; Notable for the following components:   B Natriuretic Peptide 346.6 (*)    All other components within normal limits  URINALYSIS, ROUTINE W REFLEX MICROSCOPIC - Abnormal; Notable for the following components:   Color, Urine AMBER (*)    APPearance CLOUDY (*)    Glucose, UA 50 (*)    Protein, ur 100 (*)    Leukocytes,Ua MODERATE  (*)    Bacteria, UA FEW (*)    All other components within normal limits  I-STAT CHEM 8, ED - Abnormal; Notable for the following components:   Sodium 132 (*)    Potassium 6.9 (*)    BUN 101 (*)    Creatinine, Ser 2.20 (*)    Glucose, Bld 331 (*)    Hemoglobin 11.6 (*)    HCT 34.0 (*)    All other components within normal limits  POCT I-STAT EG7 - Abnormal; Notable for the following components:   pO2, Ven 166.0 (*)    Acid-base deficit 3.0 (*)    Sodium 133 (*)    Potassium 6.9 (*)    HCT 34.0 (*)    Hemoglobin 11.6 (*)    All other components within normal limits  TROPONIN I (HIGH SENSITIVITY) - Abnormal; Notable for the following components:   Troponin I (High Sensitivity) 48 (*)    All other components within normal limits  RESPIRATORY PANEL BY RT PCR (FLU A&B, COVID)  CULTURE, BLOOD (ROUTINE X 2)  CULTURE, BLOOD (ROUTINE X 2)  PROCALCITONIN  FERRITIN  TRIGLYCERIDES    EKG EKG Interpretation  Date/Time:  Sunday 2019-09-20 14:16:36 EST Ventricular Rate:  0 PR Interval:    QRS Duration: 85 QT Interval:  301 QTC Calculation: 455 R Axis:   0 Text Interpretation: no cardiac activity Confirmed by Meridee Score [phone]) on 09/20/2019 2:18:23 PM   Radiology DG Chest Portable 1 View  Result Date: 09-20-19 CLINICAL DATA:  Respiratory distress 3 days with vomiting. EXAM: PORTABLE CHEST 1 VIEW COMPARISON:  08/20/2019 FINDINGS: Endotracheal tube has tip 2.1 cm above the carina. Enteric tube courses into the stomach and off the film as tip is not visualized. Patient  is slightly rotated to the right. Lungs are adequately inflated demonstrate subtle patchy density over the right midlung which may be due to overlapping extrapulmonary structures although subtle parenchymal airspace process is possible. No effusion. Cardiomediastinal silhouette and remainder of the exam is unchanged. IMPRESSION: Subtle hazy density over the right midlung which may be due to overlapping  extrapulmonary structures versus subtle airspace process due to atelectasis or infection. Tubes and lines as described. Electronically Signed   By: Elberta Fortis M.D.   On: Sep 27, 2019 07:30    Procedures .Critical Care Performed by: Terrilee Files, MD Authorized by: Terrilee Files, MD   Critical care provider statement:    Critical care time (minutes):  45   Critical care was necessary to treat or prevent imminent or life-threatening deterioration of the following conditions:  CNS failure or compromise and respiratory failure   Critical care was time spent personally by me on the following activities:  Discussions with consultants, evaluation of patient's response to treatment, examination of patient, ordering and performing treatments and interventions, ordering and review of laboratory studies, ordering and review of radiographic studies, pulse oximetry, re-evaluation of patient's condition, obtaining history from patient or surrogate, review of old charts, development of treatment plan with patient or surrogate and ventilator management   I assumed direction of critical care for this patient from another provider in my specialty: no   Procedure Name: Intubation Date/Time: 09/27/2019 7:15 AM Performed by: Terrilee Files, MD Pre-anesthesia Checklist: Patient identified, Patient being monitored, Emergency Drugs available, Timeout performed and Suction available Oxygen Delivery Method: Non-rebreather mask Preoxygenation: Pre-oxygenation with 100% oxygen Induction Type: Rapid sequence Ventilation: Mask ventilation without difficulty Laryngoscope Size: Glidescope and 3 Grade View: Grade II Number of attempts: 1 Placement Confirmation: ETT inserted through vocal cords under direct vision,  CO2 detector and Breath sounds checked- equal and bilateral Secured at: 21 cm Tube secured with: ETT holder Dental Injury: Teeth and Oropharynx as per pre-operative assessment       (including  critical care time)  Medications Ordered in ED Medications  acetaminophen (TYLENOL) tablet 650 mg (has no administration in time range)    Or  acetaminophen (TYLENOL) suppository 650 mg (has no administration in time range)  diphenhydrAMINE (BENADRYL) injection 25 mg (has no administration in time range)  glycopyrrolate (ROBINUL) tablet 1 mg ( Oral See Alternative September 27, 2019 1233)    Or  glycopyrrolate (ROBINUL) injection 0.2 mg ( Subcutaneous See Alternative 09/27/19 1233)    Or  glycopyrrolate (ROBINUL) injection 0.2 mg (0.2 mg Intravenous Given Sep 27, 2019 1233)  polyvinyl alcohol (LIQUIFILM TEARS) 1.4 % ophthalmic solution 1 drop (has no administration in time range)  morphine 2 MG/ML injection 2 mg (has no administration in time range)  morphine bolus via infusion 5 mg (has no administration in time range)  acetaminophen (TYLENOL) tablet 650 mg (has no administration in time range)    Or  acetaminophen (TYLENOL) suppository 650 mg (has no administration in time range)  haloperidol (HALDOL) tablet 0.5 mg ( Oral See Alternative September 27, 2019 1236)    Or  haloperidol (HALDOL) 2 MG/ML solution 0.5 mg ( Sublingual See Alternative 2019/09/27 1236)    Or  haloperidol lactate (HALDOL) injection 0.5 mg (0.5 mg Intravenous Given 09/27/2019 1236)  ondansetron (ZOFRAN-ODT) disintegrating tablet 4 mg ( Oral See Alternative Sep 27, 2019 1232)    Or  ondansetron (ZOFRAN) injection 4 mg (4 mg Intravenous Given 27-Sep-2019 1232)  antiseptic oral rinse (BIOTENE) solution 15 mL (has no administration in time range)  morphine bolus via infusion 2 mg (has no administration in time range)  morphine  in NS ( /mL) infusion - premix (0 mg/hr Intravenous Stopped 09-14-2019 1358)  LORazepam (ATIVAN) tablet 1 mg ( Oral See Alternative 2019/09/14 1235)    Or  LORazepam (ATIVAN) 2 MG/ML concentrated solution 1 mg ( Sublingual See Alternative September 14, 2019 1235)    Or  LORazepam (ATIVAN) injection 1 mg (1 mg Intravenous  Given Sep 14, 2019 1235)  etomidate (AMIDATE) injection (20 mg Intravenous Given September 14, 2019 0701)  rocuronium (ZEMURON) injection (100 mg Intravenous Given 2019/09/14 0702)  calcium gluconate 1 g/ 50 mL sodium chloride IVPB (0 g Intravenous Stopped 09-14-19 0830)  insulin aspart (novoLOG) injection 5 Units (5 Units Intravenous Given 09/14/19 0758)  sodium chloride 0.9 % bolus 1,000 mL (0 mLs Intravenous Stopped September 14, 2019 1015)    ED Course  I have reviewed the triage vital signs and the nursing notes.  Pertinent labs & imaging results that were available during my care of the patient were reviewed by me and considered in my medical decision making (see chart for details).  Clinical Course as of Sep 07 1632  Wynelle Link 2019/09/14  1610 Discussed with patient's daughter in the family room with chaplain's assistance.  Updated her on current status.  She said she has had trouble swallowing for years since her stroke.  She has been vomiting a lot and having difficulty taking her medications.  Trouble breathing.   [MB]  [phone] Patient was just admitted to [facility] in early December for sepsis.  It sounds like she had an AKI at that time but her creatinine was close to 1 on discharge.   [MB]  [id number] Discussed with Dr. Molli Knock from critical care who will evaluate the patient for admission.   [MB]  [id number] Patient with new AKI and hyperkalemia at 6.9.  Have ordered her insulin and calcium gluconate.  Critical care has been down to see the patient and talk to the patient's daughter regarding goals of care.  Awaiting their recommendations.   [MB]  1005 Dr. Molli Knock Critical Care had  prolonged discussion with POA daughter and granddaughter.  Ultimately it was agreed that we will withdraw care and make CMO.  They are waiting for a few more family members before extubation.  Morphine has been ordered.  Will not administer antibiotics.  Patient is DNR/DNI from here on in.    [MB]  1350 I was informed by the patient's nurse that  she expired at 1245.  They informed the medicine team and Dr. Nedra Hai will do the death certificate. Family has been here throughout this process   [MB]    Clinical Course User Index [MB] Terrilee Files, MD   MDM Rules/Calculators/A&P                       Final Clinical Impression(s) / ED Diagnoses Final diagnoses:  Shortness of breath  Aspiration pneumonia, unspecified aspiration pneumonia type, unspecified laterality, unspecified part of lung (HCC)  AKI (acute kidney injury) (HCC)  Hyperkalemia    Rx / DC Orders ED Discharge Orders    None       Terrilee Files, MD 2019/09/14 1635

## 2019-09-13 NOTE — Progress Notes (Signed)
[facility] Documentation  Pt is a current pt of [facility]. Liaison notes that pt has had a significant change with rapid decline.   Liaison called pt's daughter and her husband picked up and said that she is unavailable. Stated that she is in the hospital parking lot. RN encouraged husband to call [facility] for any support needed.   Please outreach [facility] with any needs.   Thank you,  Freddie Breech, RN [facility] Liaison [phone]

## 2019-09-13 NOTE — ED Notes (Signed)
Admitting MD notified. Chaplain here with family.

## 2019-09-13 NOTE — Procedures (Signed)
Extubation Procedure Note  Patient Details:   Name: Jamie Valentine DOB: 1928-05-29 MRN: [id number]   Airway Documentation:    Vent end date: September 19, 2019 Vent end time: 1259   Evaluation  O2 sats: transiently fell during during procedure Complications: No apparent complications Patient did not tolerate procedure well.     No  This was a one way exubation  PT sats steadily dropped   Jamie Valentine, Jamie Valentine 09-19-2019, 1:00 PM

## 2019-09-13 NOTE — Progress Notes (Signed)
  Chaplain responded to request from [facility] for f/u with pt's daughter.  Provided emotional support and facilitated communication with medical staff and other family members.  Charge RN advised a strict limit of 2-3 bedside, no switching.  Granddaughter Jamie Valentine (daughter of Jamie Valentine, pt's longtime caregiver, who arrived with pt) is best spokesperson for family.  [place] and one other family member are bedside now. They are coordinating prayers and goodbyes via telephone. Chaplain will secure [facility] information.    Jamie Valentine, Jamie Valentine   Sep 09, 2019 1000  Clinical Encounter Type  Visited With Patient and family together  Visit Type Patient actively dying  Referral From Chaplain  Consult/Referral To Chaplain  Spiritual Encounters  Spiritual Needs Grief support;Emotional  Stress Factors  Patient Stress Factors Major life changes  Family Stress Factors Major life changes

## 2019-09-13 NOTE — Progress Notes (Signed)
Pharmacy Antibiotic Note  Jamie Valentine is a 86 y.o. female admitted on 09-17-19 with sepsis. SCr 2.2  Plan: Aztreonam 2g x 1 then 1 g q8h Vanc 1 g x 1 then 750 q48 h - auc 415 Monitor renal fx cx vanc lvls prn  Height: 5\' 7"  (170.2 cm) Weight: 150 lb (68 kg)(from Aug 2020 records, please update) IBW/kg (Calculated) : 61.6  Temp (24hrs), Avg:98 F (36.7 C), Min:98 F (36.7 C), Max:98 F (36.7 C)  Recent Labs  Lab 09-17-2019 0730 2019/09/17 0753  CREATININE 2.20*  --   LATICACIDVEN  --  6.1*    Estimated Creatinine Clearance: 16.9 mL/min (A) (by C-G formula based on SCr of 2.2 mg/dL (H)).    Allergies  Allergen Reactions  . Sulfamethoxazole-Trimethoprim Hives and Itching  . Ciprofloxacin   . Contrast Media [Iodinated Diagnostic Agents]   . Doxazosin Nausea Only  . Levaquin [Levofloxacin]   . Macrobid [Nitrofurantoin]   . Penicillins     ChHILDHOOD ALLERGY/unknown Has patient had a PCN reaction causing immediate rash, facial/tongue/throat swelling, SOB or lightheadedness with hypotension: unknown  Has patient had a PCN reaction causing severe rash involving mucus membranes or skin necrosis: unknown  Has patient had a PCN reaction that required hospitalization unknown Has patient had a PCN reaction occurring within the last 10 years: unknown If all of the above answers are "NO", then may proceed with Cephalosporin use.   . Sulfa Antibiotics   . Amoxicillin-Pot Clavulanate Itching and Rash  . Latex Itching and Rash   Barth Kirks, PharmD, BCPS, BCCCP Clinical Pharmacist [phone]  Please check AMION for all [facility] numbers  09-17-2019 8:49 AM

## 2019-09-13 NOTE — ED Notes (Signed)
Family at bedside. Chaplain has been with family. Family and MD all understand plan of care.

## 2019-09-13 NOTE — Progress Notes (Signed)
Funeral Home:  [employer], [place]  [place]: daughter Jamie Valentine [address] [place], [place] [address] Home: [phone] Cell: [phone]  Two granddaughters Jamie Valentine and Jamie Valentine) are bedside, best family communicator:  Jamie Valentine daughter) [phone]  They understand strict visitation limit and are trying to coordinating goodbyes via home. They have spiritual support via their home church.    Please contact as needed for f/u.  Jamie Valentine, Jamie Valentine

## 2019-09-13 DEATH — deceased

## 2021-10-25 IMAGING — DX DG CHEST 1V PORT
1 series · 1 of 1 positions shown · non-contrast
Comparison: 08/20/2019

CLINICAL DATA: Respiratory distress 3 days with vomiting.

EXAM:
PORTABLE CHEST 1 VIEW

[chest ap]
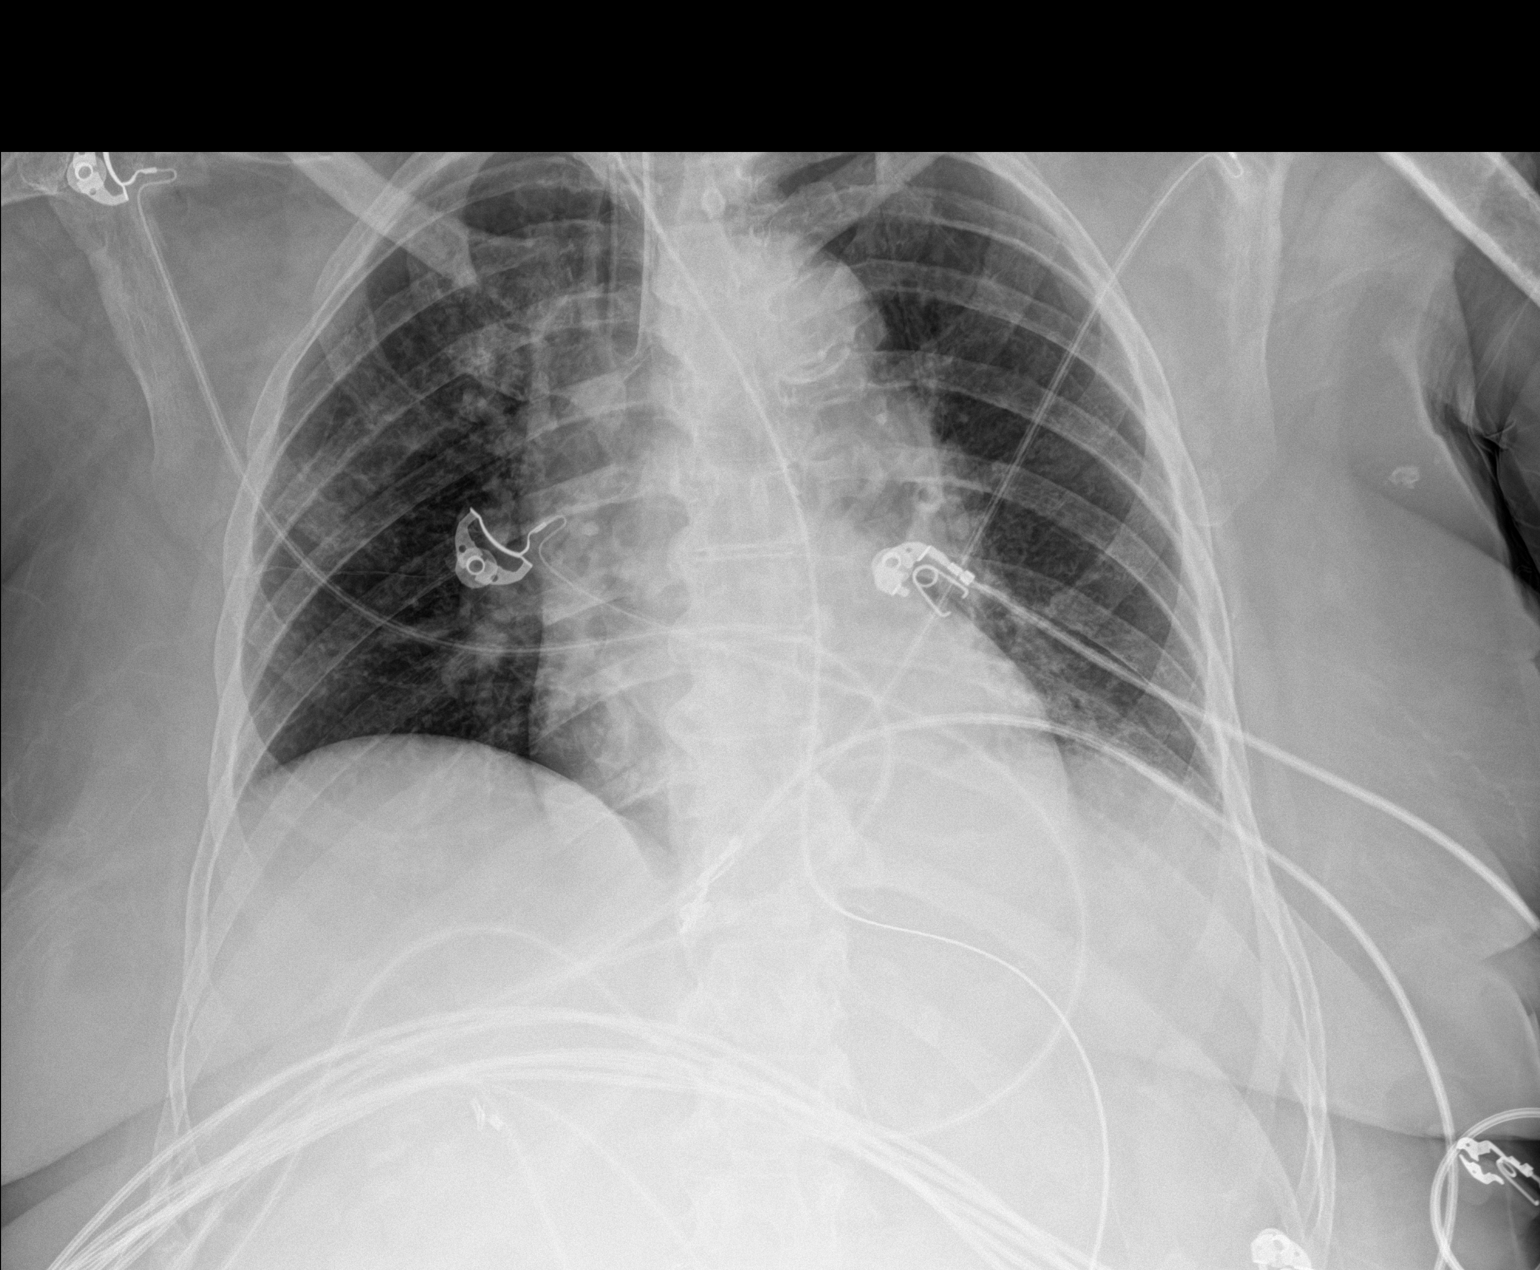

[1 of 1 positions shown; findings below may reference images not displayed]

FINDINGS: Endotracheal tube has tip 2.1 cm above the carina. Enteric tube
courses into the stomach and off the film as tip is not visualized.

Patient is slightly rotated to the right. Lungs are adequately
inflated demonstrate subtle patchy density over the right midlung
which may be due to overlapping extrapulmonary structures although
subtle parenchymal airspace process is possible. No effusion.
Cardiomediastinal silhouette and remainder of the exam is unchanged.
IMPRESSION: Subtle hazy density over the right midlung which may be due to
overlapping extrapulmonary structures versus subtle airspace process
due to atelectasis or infection.

Tubes and lines as described.
